# Patient Record
Sex: Female | Born: 1960 | Race: White | Hispanic: No | Marital: Married | Smoking: Current every day smoker
Health system: Southern US, Community
[De-identification: ages and names within clinical notes are randomized; demographics above are authoritative.]

## PROBLEM LIST (undated history)

## (undated) DIAGNOSIS — I10 Essential (primary) hypertension: Secondary | ICD-10-CM

---

## 2009-12-24 ENCOUNTER — Emergency Department: Payer: Self-pay | Admitting: Emergency Medicine

## 2010-02-02 ENCOUNTER — Inpatient Hospital Stay: Payer: Self-pay | Admitting: Unknown Physician Specialty

## 2011-01-17 ENCOUNTER — Emergency Department: Payer: Self-pay | Admitting: Emergency Medicine

## 2011-01-27 ENCOUNTER — Emergency Department: Payer: Self-pay | Admitting: Emergency Medicine

## 2011-01-28 ENCOUNTER — Emergency Department: Payer: Self-pay | Admitting: Unknown Physician Specialty

## 2013-12-12 ENCOUNTER — Emergency Department: Payer: Self-pay | Admitting: Emergency Medicine

## 2014-02-28 ENCOUNTER — Emergency Department: Payer: Self-pay | Admitting: Emergency Medicine

## 2014-07-26 ENCOUNTER — Emergency Department: Payer: Self-pay | Admitting: Emergency Medicine

## 2014-07-26 LAB — URINALYSIS, COMPLETE
BACTERIA: NONE SEEN
BILIRUBIN, UR: NEGATIVE
Glucose,UR: NEGATIVE mg/dL (ref 0–75)
Leukocyte Esterase: NEGATIVE
Nitrite: NEGATIVE
PH: 6 (ref 4.5–8.0)
Protein: NEGATIVE
RBC,UR: 1 /HPF (ref 0–5)
SPECIFIC GRAVITY: 1.017 (ref 1.003–1.030)
Squamous Epithelial: 3

## 2014-07-26 LAB — COMPREHENSIVE METABOLIC PANEL
ALT: 38 U/L
Albumin: 3.4 g/dL (ref 3.4–5.0)
Alkaline Phosphatase: 81 U/L
Anion Gap: 10 (ref 7–16)
BUN: 12 mg/dL (ref 7–18)
Bilirubin,Total: 0.4 mg/dL (ref 0.2–1.0)
CO2: 23 mmol/L (ref 21–32)
Calcium, Total: 8.7 mg/dL (ref 8.5–10.1)
Chloride: 107 mmol/L (ref 98–107)
Creatinine: 0.74 mg/dL (ref 0.60–1.30)
EGFR (African American): >60
EGFR (Non-African Amer.): >60
GLUCOSE: 94 mg/dL (ref 65–99)
Osmolality: 279 (ref 275–301)
Potassium: 3.9 mmol/L (ref 3.5–5.1)
SGOT(AST): 44 U/L — ABNORMAL HIGH (ref 15–37)
Sodium: 140 mmol/L (ref 136–145)
Total Protein: 7.9 g/dL (ref 6.4–8.2)

## 2014-07-26 LAB — CBC WITH DIFFERENTIAL/PLATELET
BASOS ABS: 0.2 10*3/uL — AB (ref 0.0–0.1)
Basophil %: 1.2 %
EOS ABS: 0.3 10*3/uL (ref 0.0–0.7)
Eosinophil %: 1.8 %
HCT: 43.1 % (ref 35.0–47.0)
HGB: 14.2 g/dL (ref 12.0–16.0)
LYMPHS ABS: 2.6 10*3/uL (ref 1.0–3.6)
LYMPHS PCT: 18.5 %
MCH: 31.5 pg (ref 26.0–34.0)
MCHC: 32.9 g/dL (ref 32.0–36.0)
MCV: 96 fL (ref 80–100)
MONOS PCT: 5.1 %
Monocyte #: 0.7 x10 3/mm (ref 0.2–0.9)
Neutrophil #: 10.2 10*3/uL — ABNORMAL HIGH (ref 1.4–6.5)
Neutrophil %: 73.4 %
Platelet: 349 10*3/uL (ref 150–440)
RBC: 4.51 10*6/uL (ref 3.80–5.20)
RDW: 13.5 % (ref 11.5–14.5)
WBC: 13.9 10*3/uL — ABNORMAL HIGH (ref 3.6–11.0)

## 2014-07-28 LAB — URINE CULTURE

## 2014-11-11 ENCOUNTER — Emergency Department: Payer: Self-pay | Admitting: Emergency Medicine

## 2016-01-01 ENCOUNTER — Emergency Department
Admission: EM | Admit: 2016-01-01 | Discharge: 2016-01-01 | Disposition: A | Discharge status: Home / Self Care | Payer: Self-pay | Attending: Emergency Medicine | Admitting: Emergency Medicine

## 2016-01-01 ENCOUNTER — Encounter: Payer: Self-pay | Admitting: Emergency Medicine

## 2016-01-01 DIAGNOSIS — Z5320 Procedure and treatment not carried out because of patient's decision for unspecified reasons: Secondary | ICD-10-CM

## 2016-01-01 DIAGNOSIS — F17200 Nicotine dependence, unspecified, uncomplicated: Secondary | ICD-10-CM | POA: Insufficient documentation

## 2016-01-01 DIAGNOSIS — K219 Gastro-esophageal reflux disease without esophagitis: Secondary | ICD-10-CM | POA: Insufficient documentation

## 2016-01-01 DIAGNOSIS — R1013 Epigastric pain: Secondary | ICD-10-CM

## 2016-01-01 MED ORDER — PROMETHAZINE HCL 25 MG PO TABS: 25.0000 mg | ORAL_TABLET | Freq: Four times a day (QID) | ORAL | Status: DC | PRN

## 2016-01-01 MED ORDER — OMEPRAZOLE 20 MG PO CPDR: 20.0000 mg | DELAYED_RELEASE_CAPSULE | Freq: Two times a day (BID) | ORAL | Status: DC

## 2016-01-01 NOTE — ED Notes (Signed)
Pt presents with epigastric pain for five days. Has tried otc med with no relief. Pt is sch to see a specialist soon. Pt states she just wants something to help with nausea and does not want any testing done today. Pt states she does not have insurance.

## 2016-01-01 NOTE — ED Provider Notes (Signed)
CSN: [id number]     Arrival date & time 01/01/16  1035 History   First MD Initiated Contact with Patient 01/01/16 1147     Chief Complaint  Patient presents with  . Abdominal Pain      HPI Comments: 55 year old female presents today complaining of reflux symptoms and epigastric abdominal pain for the past year. Pt reports over the past 5 days symptoms have gotten worse. Continues to smoke 1/4 ppd of cigarettes. She has taken zantac, TUMS and nexium over the counter without relief. She does not complain of chest pain, dyspnea or sweats. She does have nausea with occasional vomiting. Is set up to see a GI specialist at [facility] on sliding scale since she is uninsured. She is refusing to have any testing performed, only wants medications for her symptoms.    History reviewed. No pertinent past medical history. History reviewed. No pertinent past surgical history. No family history on file. Social History  Substance Use Topics  . Smoking status: Current Some Day Smoker  . Smokeless tobacco: None  . Alcohol Use: No   OB History    No data available     Review of Systems  Respiratory: Negative for shortness of breath.   Cardiovascular: Negative for chest pain.  Gastrointestinal: Positive for nausea and abdominal pain. Negative for vomiting and diarrhea.  All other systems reviewed and are negative.     Allergies  Codeine  Home Medications   Prior to Admission medications   Medication Sig Start Date End Date Taking? Authorizing Provider  omeprazole (PRILOSEC) 20 MG capsule Take 1 capsule (20 mg total) by mouth 2 (two) times daily. 01/01/16 12/31/16  Christella Scheuermann, PA-C  promethazine (PHENERGAN) 25 MG tablet Take 1 tablet (25 mg total) by mouth every 6 (six) hours as needed for nausea or vomiting. 01/01/16   Suan Halter V, PA-C   BP 150/81 mmHg  Pulse 94  Temp(Src) 97.9 F (36.6 C) (Oral)  Resp 20  Ht  (1.575 m)  Wt 74.844 kg  BMI 30.17 kg/m2  SpO2 98% Physical Exam   Constitutional: She is oriented to person, place, and time. Vital signs are normal. She appears well-developed and well-nourished. She is active.  Non-toxic appearance. She does not have a sickly appearance. She does not appear ill.  HENT:  Head: Normocephalic and atraumatic.  Cardiovascular: Normal rate, regular rhythm, normal heart sounds and intact distal pulses.  Exam reveals no gallop and no friction rub.   No murmur heard. Pulmonary/Chest: Effort normal and breath sounds normal. No respiratory distress. She has no wheezes. She has no rales.  Abdominal: Soft. Bowel sounds are normal. She exhibits no distension. There is tenderness. There is no rebound and no guarding.  Musculoskeletal: Normal range of motion.  Neurological: She is alert and oriented to person, place, and time.  Skin: Skin is warm and dry.  Psychiatric: She has a normal mood and affect. Her behavior is normal. Judgment and thought content normal.  Nursing note and vitals reviewed.   ED Course  Procedures (including critical care time) Labs Review Labs Reviewed - No data to display  Imaging Review No results found. I have personally reviewed and evaluated these images and lab results as part of my medical decision-making.   EKG Interpretation None      MDM  Long discussion with patient regarding my recommended treatment plan. I recommended we check a full set of labs including cardiac enzymes given location of pain. Also recommended  plain films of chest, EKG and possible abdominal imaging pending results. Pt refuses all treatments citing she does not want to pay for these services. She does not have a PCP, will be seen by [facility]. She understands she could have heart disease, perforated ulcer, cholecystitis, internal bleeding or a variety of other conditions that could result in her death. I am unable to evaluate if any of these are present because of her refusal, and she acknowledges we are not liable given she  refused tese evaluations  Encouraged her to quit smoking and establish PCP Prilosec  BID and phenergan as needed for nausea   Final diagnoses:  Gastroesophageal reflux disease, esophagitis presence not specified  Epigastric pain  Refusal of treatment        Christella Scheuermann, PA-C 01/01/16 1231  Myrna Blazer, MD 01/01/16 [phone]

## 2016-01-01 NOTE — ED Notes (Signed)
Pt to ed with c/o upper abd pain, states she is currently under treatment for reflux and has appt, with specialist soon.  Pt does not want any testing done today.  Pt states any food makes pain worse.  Tried otcs without extended relief.

## 2019-12-27 ENCOUNTER — Ambulatory Visit: Payer: Self-pay | Attending: Internal Medicine

## 2019-12-27 DIAGNOSIS — Z20822 Contact with and (suspected) exposure to covid-19: Secondary | ICD-10-CM | POA: Insufficient documentation

## 2019-12-29 ENCOUNTER — Telehealth: Payer: Self-pay

## 2019-12-29 LAB — NOVEL CORONAVIRUS, NAA: SARS-CoV-2, NAA: NOT DETECTED

## 2019-12-29 NOTE — Telephone Encounter (Signed)
Pt notified of negative COVID-19 results. Understanding verbalized.  Karen Dalton   

## 2019-12-29 NOTE — Telephone Encounter (Signed)
Caller advise result not back yet 

## 2020-01-20 ENCOUNTER — Other Ambulatory Visit: Payer: Self-pay

## 2020-01-23 ENCOUNTER — Other Ambulatory Visit: Payer: Self-pay

## 2021-06-19 ENCOUNTER — Encounter: Payer: Self-pay | Admitting: Emergency Medicine

## 2021-06-19 ENCOUNTER — Emergency Department: Payer: No Typology Code available for payment source

## 2021-06-19 ENCOUNTER — Other Ambulatory Visit: Payer: Self-pay

## 2021-06-19 ENCOUNTER — Emergency Department
Admission: EM | Admit: 2021-06-19 | Discharge: 2021-06-19 | Disposition: A | Discharge status: Home / Self Care | Payer: No Typology Code available for payment source | Attending: Emergency Medicine | Admitting: Emergency Medicine

## 2021-06-19 DIAGNOSIS — F17200 Nicotine dependence, unspecified, uncomplicated: Secondary | ICD-10-CM | POA: Diagnosis not present

## 2021-06-19 DIAGNOSIS — S300XXA Contusion of lower back and pelvis, initial encounter: Secondary | ICD-10-CM | POA: Diagnosis not present

## 2021-06-19 DIAGNOSIS — S4991XA Unspecified injury of right shoulder and upper arm, initial encounter: Secondary | ICD-10-CM | POA: Diagnosis present

## 2021-06-19 DIAGNOSIS — M542 Cervicalgia: Secondary | ICD-10-CM | POA: Diagnosis not present

## 2021-06-19 DIAGNOSIS — S20229A Contusion of unspecified back wall of thorax, initial encounter: Secondary | ICD-10-CM

## 2021-06-19 DIAGNOSIS — S7002XA Contusion of left hip, initial encounter: Secondary | ICD-10-CM | POA: Diagnosis not present

## 2021-06-19 DIAGNOSIS — Y990 Civilian activity done for income or pay: Secondary | ICD-10-CM | POA: Diagnosis not present

## 2021-06-19 DIAGNOSIS — R110 Nausea: Secondary | ICD-10-CM | POA: Diagnosis not present

## 2021-06-19 DIAGNOSIS — S7000XA Contusion of unspecified hip, initial encounter: Secondary | ICD-10-CM

## 2021-06-19 DIAGNOSIS — W19XXXA Unspecified fall, initial encounter: Secondary | ICD-10-CM

## 2021-06-19 DIAGNOSIS — W010XXA Fall on same level from slipping, tripping and stumbling without subsequent striking against object, initial encounter: Secondary | ICD-10-CM | POA: Diagnosis not present

## 2021-06-19 DIAGNOSIS — S40011A Contusion of right shoulder, initial encounter: Secondary | ICD-10-CM | POA: Insufficient documentation

## 2021-06-19 MED ORDER — METHOCARBAMOL 500 MG PO TABS: 500.0000 mg | ORAL_TABLET | Freq: Four times a day (QID) | ORAL | 0 refills | Status: DC | PRN

## 2021-06-19 MED ORDER — ETODOLAC 400 MG PO TABS: 400.0000 mg | ORAL_TABLET | Freq: Two times a day (BID) | ORAL | 0 refills | Status: AC

## 2021-06-19 MED ORDER — ONDANSETRON 4 MG PO TBDP
4.0000 mg | ORAL_TABLET | Freq: Once | ORAL | Status: AC
  Administered 2021-06-19: 4 mg via ORAL
  Filled 2021-06-19: qty 1

## 2021-06-19 MED ORDER — ONDANSETRON 4 MG PO TBDP: 4.0000 mg | ORAL_TABLET | Freq: Three times a day (TID) | ORAL | 0 refills | Status: DC | PRN

## 2021-06-19 NOTE — ED Provider Notes (Signed)
[facility] Emergency Department Provider Note   ____________________________________________   Event Date/Time   First MD Initiated Contact with Patient 06/19/21 1207     (approximate)  I have reviewed the triage vital signs and the nursing notes.   HISTORY  Chief Complaint Back Pain, Neck Pain, and Spasms    HPI Karen Dalton is a 60 y.o. female presents to the ED with complaint of low back, left hip and cervical pain.  Patient states that she fell while at work slipping on a lemon wedge on the floor.  This occurred 6 days ago and initially she was seen at an urgent care where she was given muscle relaxants and tramadol.  No x-rays were taken at that time.  Patient reports that the tramadol made her nauseous and she has been vomiting.  She has gotten minimal relief from these medications.  She rates her pain as a 9 out of 10.         History reviewed. No pertinent past medical history.  There are no problems to display for this patient.   History reviewed. No pertinent surgical history.  Prior to Admission medications   Medication Sig Start Date End Date Taking? Authorizing Provider  DULoxetine (CYMBALTA) 30 MG capsule Take 30 mg by mouth daily.   Yes [provider]  etodolac (LODINE) 400 MG tablet Take 1 tablet (400 mg total) by mouth 2 (two) times daily. 06/19/21 06/19/22 Yes Elvin Mccartin L, PA-C  methocarbamol (ROBAXIN) 500 MG tablet Take 1 tablet (500 mg total) by mouth every 6 (six) hours as needed for muscle spasms. 06/19/21  Yes Bridget Hartshorn L, PA-C  ondansetron (ZOFRAN ODT) 4 MG disintegrating tablet Take 1 tablet (4 mg total) by mouth every 8 (eight) hours as needed for nausea or vomiting. 06/19/21  Yes Bridget Hartshorn L, PA-C    Allergies Codeine and Tramadol  History reviewed. No pertinent family history.  Social History Social History   Tobacco Use   Smoking status: Some Days    Pack years: 0.00  Substance Use Topics    Alcohol use: No    Review of Systems Constitutional: No fever/chills Eyes: No visual changes. ENT: No trauma. Cardiovascular: Denies chest pain. Respiratory: Denies shortness of breath. Gastrointestinal: No abdominal pain.  No nausea, positive vomiting.  Genitourinary: Negative for dysuria. Musculoskeletal: Left hip, cervical spine, right shoulder and lower back pain. Skin: Negative for rash. Neurological: Negative for headaches, focal weakness or numbness. ____________________________________________   PHYSICAL EXAM:  VITAL SIGNS: ED Triage Vitals  Enc Vitals Group     BP 06/19/21 1114 (!) 158/108     Pulse Rate 06/19/21 1114 70     Resp 06/19/21 1114 20     Temp 06/19/21 1114 98 F (36.7 C)     Temp Source 06/19/21 1114 Oral     SpO2 06/19/21 1114 97 %     Weight 06/19/21 1115 164 lb 14.5 oz (74.8 kg)     Height 06/19/21 1115 5\' 2"  (1.575 m)     Head Circumference --      Peak Flow --      Pain Score 06/19/21 1114 9     Pain Loc --      Pain Edu? --      Excl. in GC? --     Constitutional: Alert and oriented. Well appearing and in no acute distress. Eyes: Conjunctivae are normal. PERRL. EOMI. Head: Atraumatic. Nose: No trauma. Neck: No stridor.  No tenderness on  palpation of cervical spine posteriorly. Cardiovascular: Normal rate, regular rhythm. Grossly normal heart sounds.  Good peripheral circulation. Respiratory: Normal respiratory effort.  No retractions. Lungs CTAB. Gastrointestinal: Soft and nontender. No distention.  Musculoskeletal: Moderate generalized tenderness on palpation of the right shoulder posteriorly.  Range of motion is slow and guarded secondary to discomfort.  Patient is also tender on palpation of thoracic and lumbar spine.  No obvious deformity is noted no step-offs are appreciated.  Skin is without discoloration.  Left hip is tender to palpation generalized posteriorly and lateral area.  No gross deformity and no rotation or shortening of  the leg is appreciated. Neurologic:  Normal speech and language. No gross focal neurologic deficits are appreciated.  Skin:  Skin is warm, dry and intact. No rash noted. Psychiatric: Mood and affect are normal. Speech and behavior are normal.  ____________________________________________   LABS (all labs ordered are listed, but only abnormal results are displayed)  Labs Reviewed - No data to display ____________________________________________  EKG  ____________________________________________  RADIOLOGY Beaulah Corin, personally viewed and evaluated these images (plain radiographs) as part of my medical decision making, as well as reviewing the written report by the radiologist.   Official radiology report(s): DG Thoracic Spine 2 View  Result Date: 06/19/2021 CLINICAL DATA:  pain/ fall injury EXAM: THORACIC SPINE 2 VIEWS COMPARISON:  None. FINDINGS: There is no evidence of thoracic spine fracture. There are multilevel degenerative changes. IMPRESSION: No evidence of thoracic spine fracture. Mild multilevel degenerative changes. Electronically Signed   By: Caprice Renshaw   On: 06/19/2021 14:10   DG Lumbar Spine 2-3 Views  Result Date: 06/19/2021 CLINICAL DATA:  pain/ fall injury EXAM: LUMBAR SPINE - 2-3 VIEW COMPARISON:  None. FINDINGS: There are 5 non-rib-bearing lumbar vertebrae. There is no evidence of lumbar spine fracture. There is moderate disc height loss at L2-L3 and L5-S1. There is mild disc height loss at L3-L4. There is mild-moderate lower lumbar predominant facet arthritis. Calcified mass overlying the pelvis, likely calcified uterine fibroid. IMPRESSION: No evidence of lumbar spine fracture. Multilevel degenerative disc disease, worst at L2-L3 and L5-S1 with moderate disc height loss. Mild to moderate lower lumbar predominant facet arthritis. Electronically Signed   By: Caprice Renshaw   On: 06/19/2021 14:12   DG Shoulder Right  Result Date: 06/19/2021 CLINICAL DATA:  pain/  fall injury EXAM: RIGHT SHOULDER - 2+ VIEW COMPARISON:  None. FINDINGS: There is no evidence of acute fracture or dislocation. There is mild glenohumeral and AC joint degenerative change. IMPRESSION: No acute fracture or dislocation. Electronically Signed   By: Caprice Renshaw   On: 06/19/2021 14:13   DG HIP UNILAT WITH PELVIS 2-3 VIEWS LEFT  Result Date: 06/19/2021 CLINICAL DATA:  Left hip pain EXAM: DG HIP (WITH OR WITHOUT PELVIS) 2-3V LEFT COMPARISON:  None. FINDINGS: There is no evidence of hip fracture or dislocation. There is no evidence of arthropathy or other focal bone abnormality. There is a calcified mass overlying the pelvis, likely calcified uterine fibroid. IMPRESSION: Negative left hip radiographs. Electronically Signed   By: Caprice Renshaw   On: 06/19/2021 14:08    ____________________________________________   PROCEDURES  Procedure(s) performed (including Critical Care):  Procedures   ____________________________________________   INITIAL IMPRESSION / ASSESSMENT AND PLAN / ED COURSE  As part of my medical decision making, I reviewed the following data within the electronic MEDICAL RECORD NUMBER Notes from prior ED visits and [place] Controlled Substance Database  60 year old female presents to the  ED with multiple muscle skeletal pain after she slipped on a lemon wedge 6 days ago.  Initially she was seen at urgent care and started on medication.  She states that the tramadol caused nausea and vomiting and that the Flexeril and Voltaren have not helped with her pain.  Patient was given Zofran while in the ED.  X-rays were negative for any acute bony injury however she did have some degenerative changes noted in her lumbar and thoracic spine.  This was discussed with patient.  She is to discontinue taking the tramadol.  A prescription for Zofran was sent to the pharmacy along with etodolac 400 mg twice daily, methocarbamol 500 mg every 6 hours and Zofran if needed for nausea.  Patient is to  follow-up with her companies [employer]. to see if they have a specific doctor that they wish for her to see.  She also may follow-up with urgent care if any continued problems.  She is encouraged to use ice or heat to her muscles as needed for discomfort.  She is return to the emergency department if any severe worsening of her symptoms.   ____________________________________________   FINAL CLINICAL IMPRESSION(S) / ED DIAGNOSES  Final diagnoses:  Fall  Contusion of back, unspecified laterality, initial encounter  Contusion of right shoulder, initial encounter  Contusion of hip, unspecified laterality, initial encounter     ED Discharge Orders          Ordered    ondansetron (ZOFRAN ODT) 4 MG disintegrating tablet  Every 8 hours PRN        06/19/21 1439    etodolac (LODINE) 400 MG tablet  2 times daily        06/19/21 1439    methocarbamol (ROBAXIN) 500 MG tablet  Every 6 hours PRN        06/19/21 1439             Note:  This document was prepared using Dragon voice recognition software and may include unintentional dictation errors.    Tommi Rumps, PA-C 06/19/21 1538    Arnaldo Natal, MD 06/19/21 [phone]

## 2021-06-19 NOTE — ED Notes (Signed)
Patient transported to X-ray 

## 2021-06-19 NOTE — ED Triage Notes (Signed)
Pt comes into the ED via POV c/o low back over the left hip and neck pain.  Pt also states she has muscles spasms in her back and down through her leg.  All this started after slipping on a lemon wedge at work last Thursday.  Pt has been taking muscle relaxers and tramadol, but the tramadol made her nauseas.

## 2021-06-19 NOTE — ED Notes (Signed)
Pt calm , collective, ambulatory upon discharge 

## 2021-06-19 NOTE — Discharge Instructions (Addendum)
Continue taking medication that you received from [facility] to prevent any further confusion.  Prescription were printed so that if you find out that your company prefers to to get your prescriptions filled at a particular pharmacy you will have your prescriptions in your hand.  Medications only as directed.  The etodolac is 400 mg twice daily with food for inflammation and pain, methocarbamol is 1 every 6 hours as needed for muscle spasms and Zofran if needed for nausea or vomiting.  You may use ice or heat to your back, hip, and shoulder as needed for discomfort.  If any further evaluation or treatment is needed you should follow-up with the doctor that your company uses as Workmen's Comp.

## 2021-09-29 ENCOUNTER — Other Ambulatory Visit: Payer: Self-pay

## 2021-09-29 ENCOUNTER — Emergency Department: Payer: Self-pay

## 2021-09-29 ENCOUNTER — Emergency Department
Admission: EM | Admit: 2021-09-29 | Discharge: 2021-09-29 | Disposition: A | Discharge status: Home / Self Care | Payer: Self-pay | Attending: Emergency Medicine | Admitting: Emergency Medicine

## 2021-09-29 DIAGNOSIS — R0781 Pleurodynia: Secondary | ICD-10-CM

## 2021-09-29 DIAGNOSIS — R051 Acute cough: Secondary | ICD-10-CM

## 2021-09-29 DIAGNOSIS — Z8616 Personal history of COVID-19: Secondary | ICD-10-CM | POA: Insufficient documentation

## 2021-09-29 DIAGNOSIS — S2232XA Fracture of one rib, left side, initial encounter for closed fracture: Secondary | ICD-10-CM | POA: Insufficient documentation

## 2021-09-29 DIAGNOSIS — R059 Cough, unspecified: Secondary | ICD-10-CM | POA: Insufficient documentation

## 2021-09-29 DIAGNOSIS — F17200 Nicotine dependence, unspecified, uncomplicated: Secondary | ICD-10-CM | POA: Insufficient documentation

## 2021-09-29 DIAGNOSIS — W19XXXA Unspecified fall, initial encounter: Secondary | ICD-10-CM | POA: Insufficient documentation

## 2021-09-29 MED ORDER — HYDROCODONE-ACETAMINOPHEN 5-325 MG PO TABS: 1.0000 | ORAL_TABLET | Freq: Four times a day (QID) | ORAL | 0 refills | Status: DC | PRN

## 2021-09-29 MED ORDER — HYDROCODONE-ACETAMINOPHEN 5-325 MG PO TABS
2.0000 | ORAL_TABLET | Freq: Once | ORAL | Status: AC
  Administered 2021-09-29: 2 via ORAL
  Filled 2021-09-29: qty 2

## 2021-09-29 NOTE — ED Provider Notes (Addendum)
[facility] Emergency Department Provider Note  ____________________________________________   Event Date/Time   First MD Initiated Contact with Patient 09/29/21 0335     (approximate)  I have reviewed the triage vital signs and the nursing notes.   HISTORY  Chief Complaint Chest Pain (Home viam ems rib pain 2 days)    HPI Karen Dalton is a 60 y.o. female who presents for evaluation of left lateral lower sided rib pain.  She says she had a mechanical fall couple of nights ago but the pain is steadily gotten worse.  She now feels like there is a sharp stabbing pain that she is afraid is a piece of bone going into her lungs.  It is worse with deep breaths and worse with cough.  She has had a cough for a while now because she was diagnosed with COVID-19 2 to 3 weeks ago.  She said that the cough comes and goes but has been worse over the last 24 hours.  The pain is sharp and stabbing.  Nothing particular makes it better.  She denies fever, any other type of chest pain, nausea, vomiting, and abdominal pain.     No past medical history on file.  There are no problems to display for this patient.   No past surgical history on file.  Prior to Admission medications   Medication Sig Start Date End Date Taking? Authorizing Provider  HYDROcodone-acetaminophen (NORCO/VICODIN) 5-325 MG tablet Take 1-2 tablets by mouth every 6 (six) hours as needed for moderate pain or severe pain. 09/29/21  Yes Loleta Rose, MD  DULoxetine (CYMBALTA) 30 MG capsule Take 30 mg by mouth daily.    [provider]  etodolac (LODINE) 400 MG tablet Take 1 tablet (400 mg total) by mouth 2 (two) times daily. 06/19/21 06/19/22  Tommi Rumps, PA-C  methocarbamol (ROBAXIN) 500 MG tablet Take 1 tablet (500 mg total) by mouth every 6 (six) hours as needed for muscle spasms. 06/19/21   Tommi Rumps, PA-C  ondansetron (ZOFRAN ODT) 4 MG disintegrating tablet Take 1 tablet (4 mg total)  by mouth every 8 (eight) hours as needed for nausea or vomiting. 06/19/21   Tommi Rumps, PA-C    Allergies Codeine and Tramadol  No family history on file.  Social History Social History   Tobacco Use   Smoking status: Some Days  Substance Use Topics   Alcohol use: No    Review of Systems Constitutional: No fever/chills Eyes: No visual changes. ENT: No sore throat. Cardiovascular: Left lateral lower rib/chest pain. Respiratory: Positive for cough and some shortness of breath associated with the chest pain. Gastrointestinal: No abdominal pain.  No nausea, no vomiting.  No diarrhea.  No constipation. Genitourinary: Negative for dysuria. Musculoskeletal: Negative for neck pain.  Negative for back pain. Integumentary: Negative for rash. Neurological: Negative for headaches, focal weakness or numbness.   ____________________________________________   PHYSICAL EXAM:  VITAL SIGNS: ED Triage Vitals  Enc Vitals Group     BP 09/29/21 0338 (!) 169/72     Pulse Rate 09/29/21 0338 65     Resp 09/29/21 0338 11     Temp 09/29/21 0338 97.6 F (36.4 C)     Temp Source 09/29/21 0338 Oral     SpO2 09/29/21 0331 90 %     Weight --      Height --      Head Circumference --      Peak Flow --  Pain Score 09/29/21 0341 10     Pain Loc --      Pain Edu? --      Excl. in GC? --     Constitutional: Alert and oriented.  Appears uncomfortable. Eyes: Conjunctivae are normal.  Head: Atraumatic. Nose: No congestion/rhinnorhea. Mouth/Throat: Patient is wearing a mask. Neck: No stridor.  No meningeal signs.   Cardiovascular: Normal rate, regular rhythm. Good peripheral circulation. Respiratory: Increased respiratory effort but it is because the patient is splinting due to pain.  Lungs are clear to auscultation.  Occasional mild dry cough. Gastrointestinal: Soft and nontender. No distention.  Musculoskeletal: Severe tenderness to palpation of the left lateral lower and posterior  ribs.  There is a an area of bruising right at the bottom of her ribs on the lateral aspects and slightly posterior. Neurologic:  Normal speech and language. No gross focal neurologic deficits are appreciated.  Skin:  Skin is warm, dry and intact.  Mild bruising as described above.   ____________________________________________    RADIOLOGY I, Loleta Rose, personally viewed and evaluated these images (plain radiographs) as part of my medical decision making, as well as reviewing the written report by the radiologist.  ED MD interpretation: Possible multiple fractures on chest x-ray, but verified on CT scan of the chest that there is a left eighth rib fracture with a small adjacent subpleural hemorrhage.  Official radiology report(s): DG Ribs Unilateral W/Chest Left  Result Date: 09/29/2021 CLINICAL DATA:  Fall, COVID positive EXAM: LEFT RIBS AND CHEST - 3+ VIEW COMPARISON:  07/26/2014 FINDINGS: Lungs are essentially clear.  No pleural effusion or pneumothorax. The heart is normal in size. Mildly displaced left posterolateral 8th rib fracture. Nondisplaced left posterolateral 6th rib fracture. IMPRESSION: Left posterolateral 6th and 8th rib fractures, as above. No pneumothorax. Electronically Signed   By: Charline Bills M.D.   On: 09/29/2021 04:01   CT Chest Wo Contrast  Result Date: 09/29/2021 CLINICAL DATA:  Rib fracture suspected EXAM: CT CHEST WITHOUT CONTRAST TECHNIQUE: Multidetector CT imaging of the chest was performed following the standard protocol without IV contrast. COMPARISON:  Radiography from earlier today FINDINGS: Cardiovascular: No significant vascular findings. Normal heart size. No pericardial effusion. Atheromatous calcification of the right coronary artery Mediastinum/Nodes: No hematoma or pneumomediastinum Lungs/Pleura: There is no edema, consolidation, effusion, or pneumothorax. Mild atelectasis. Upper Abdomen: Partially covered low-density in the inferior right  liver, stable from 2020 CT with there was cystic density. Musculoskeletal: Mildly displaced left posterior eighth rib fracture with adjacent subpleural hemorrhage. Midthoracic disc degeneration. IMPRESSION: 1. Left eighth rib fracture.  No hemothorax or pneumothorax. 2. Coronary calcification. Electronically Signed   By: Tiburcio Pea M.D.   On: 09/29/2021 05:00    ____________________________________________  EKG  ED ECG REPORT I, Loleta Rose, the attending physician, personally viewed and interpreted this ECG.  Date: 09/29/2021 EKG Time: 3:38 AM Rate: 64 Rhythm: normal sinus rhythm QRS Axis: normal Intervals: normal ST/T Wave abnormalities: Non-specific ST segment / T-wave changes, but no clear evidence of acute ischemia. Narrative Interpretation: no definitive evidence of acute ischemia; does not meet STEMI criteria.    ____________________________________________   INITIAL IMPRESSION / MDM / ASSESSMENT AND PLAN / ED COURSE  As part of my medical decision making, I reviewed the following data within the electronic MEDICAL RECORD NUMBER Nursing notes reviewed and incorporated, Old chart reviewed, Radiograph reviewed , Notes from prior ED visits, and [place] Controlled Substance Database   Differential diagnosis includes, but is not limited to,  rib fracture, rib contusion, pneumothorax, pleural effusion, interstitial edema, atypical infection due to COVID-19, developing pneumonia.  I strongly suspect the patient has at least a rib contusion if not a fracture based on the physical exam and the nature of her symptoms.  Lungs are clear to auscultation and equal bilaterally and I do not think she has a pneumothorax.  Given the degree of severe pain she is experiencing I will give 2 Norco which will also help as an antitussive and we will obtain left-sided rib x-rays with a chest film as well.  She may benefit from a CT scan for additional assessment of the lung parenchyma as well as of the ribs,  but I will reassess after x-rays.     Clinical Course as of 09/29/21 1610  Wynelle Link Sep 29, 2021  0404 I personally reviewed the patient's imaging and agree with the radiologist's interpretation that there are at least 2 rib fractures, but given the amount of distress and discomfort she is experiencing, I am concerned that there may be more extensive injuries.  Given the morbidity and mortality associated with multiple rib fractures, even though she is not a geriatric population, I think it is appropriate to further assess with a CT chest without contrast. [CF]  9604 The patient is now much more comfortable.  I updated her about the 1 rib fractures seen on x-ray with a little bit of bleeding surrounding it.  I stressed to her the importance of pain control and use of incentive spirometer.  She said that she understands.  I gave strict return precautions as well as instructions about how to use the incentive spirometer and she understands and agrees with the plan and is comfortable going home. [CF]    Clinical Course User Index [CF] Loleta Rose, MD     ____________________________________________  FINAL CLINICAL IMPRESSION(S) / ED DIAGNOSES  Final diagnoses:  Rib pain on left side  Acute cough  Fall, initial encounter  Closed fracture of one rib of left side, initial encounter     MEDICATIONS GIVEN DURING THIS VISIT:  Medications  HYDROcodone-acetaminophen (NORCO/VICODIN) 5-325 MG per tablet 2 tablet (2 tablets Oral Given 09/29/21 0400)     ED Discharge Orders          Ordered    HYDROcodone-acetaminophen (NORCO/VICODIN) 5-325 MG tablet  Every 6 hours PRN        09/29/21 5409             Note:  This document was prepared using Dragon voice recognition software and may include unintentional dictation errors.   Loleta Rose, MD 09/29/21 8119    Loleta Rose, MD 09/29/21 [phone]

## 2021-09-29 NOTE — ED Notes (Signed)
Provided education on incentive spirometer.

## 2021-09-29 NOTE — Discharge Instructions (Signed)
Your workup today showed that you have a fracture to one or more ribs.  Unfortunately this type of injury hurts but there is no way to fix it immediately; it must heal over time.  Be sure to take plenty of deep breaths so that you get rid of the "bad air" in your lungs.  If you are given a device called an incentive spirometer, please use it as recommended.  Unless you have been told by your doctor not to do so, we recommend you take ibuprofen 600 mg 3 times daily with meals for no more than 5 days.  You can also take Tylenol 1000 mg every 6 hours for pain. Take Norco  as prescribed for severe pain. Do not drink alcohol, drive or participate in any other potentially dangerous activities while taking this medication as it may make you sleepy. Do not take this medication with any other sedating medications, either prescription or over-the-counter. If you were prescribed Percocet or Vicodin, do not take these with acetaminophen (Tylenol) as it is already contained within these medications.   This medication is an opiate (or narcotic) pain medication and can be habit forming.  Use it as little as possible to achieve adequate pain control.  Do not use or use it with extreme caution if you have a history of opiate abuse or dependence.  If you are on a pain contract with your primary care doctor or a pain specialist, be sure to let them know you were prescribed this medication today from the [facility] Emergency Department.  This medication is intended for your use only - do not give any to anyone else and keep it in a secure place where nobody else, especially children, have access to it.  It will also cause or worsen constipation, so you may want to consider taking an over-the-counter stool softener while you are taking this medication.   Follow-up at the clinics or with the doctors described in this paperwork.  Return to the emergency department if he develop new or worsening symptoms that concern you.

## 2021-09-29 NOTE — ED Triage Notes (Signed)
Pt dx of covid approx 2-3 weeks ago. Fell 2 days ago. Visible bruising und bra in back on left side. Severe sharp pain with cough. Fall was a one time occurrence d/t furniture in room. Pt now complains of congestion, severe sharp stabbing pain with cough.  Took tessalon pearls tonight. Was scared to cough when she woke up tonight afraid she was going to puncture her lung.

## 2022-06-25 ENCOUNTER — Emergency Department: Payer: Self-pay

## 2022-06-25 ENCOUNTER — Other Ambulatory Visit: Payer: Self-pay

## 2022-06-25 ENCOUNTER — Emergency Department
Admission: EM | Admit: 2022-06-25 | Discharge: 2022-06-25 | Disposition: A | Discharge status: Home / Self Care | Payer: Self-pay | Attending: Emergency Medicine | Admitting: Emergency Medicine

## 2022-06-25 DIAGNOSIS — W01198A Fall on same level from slipping, tripping and stumbling with subsequent striking against other object, initial encounter: Secondary | ICD-10-CM | POA: Insufficient documentation

## 2022-06-25 DIAGNOSIS — Z23 Encounter for immunization: Secondary | ICD-10-CM | POA: Insufficient documentation

## 2022-06-25 DIAGNOSIS — S0990XA Unspecified injury of head, initial encounter: Secondary | ICD-10-CM | POA: Insufficient documentation

## 2022-06-25 DIAGNOSIS — W19XXXA Unspecified fall, initial encounter: Secondary | ICD-10-CM

## 2022-06-25 DIAGNOSIS — S0083XA Contusion of other part of head, initial encounter: Secondary | ICD-10-CM | POA: Insufficient documentation

## 2022-06-25 MED ORDER — HYDROCODONE-ACETAMINOPHEN 5-325 MG PO TABS: 1.0000 | ORAL_TABLET | ORAL | 0 refills | Status: AC | PRN

## 2022-06-25 MED ORDER — TETANUS-DIPHTH-ACELL PERTUSSIS 5-2.5-18.5 LF-MCG/0.5 IM SUSY
0.5000 mL | PREFILLED_SYRINGE | Freq: Once | INTRAMUSCULAR | Status: AC
  Administered 2022-06-25: 0.5 mL via INTRAMUSCULAR
  Filled 2022-06-25: qty 0.5

## 2022-06-25 NOTE — ED Provider Triage Note (Signed)
Emergency Medicine Provider Triage Evaluation Note  Karen Dalton , a 61 y.o. female  was evaluated in triage.  Pt complains of fall. Tripped on a brick and fell face first last night. Unsure if she lost consciousness. Reports that she has a headache and facial pain. No blood thinners.  Review of Systems  Positive: Headache, face pain, nausea Negative: vomiting  Physical Exam  There were no vitals taken for this visit. Gen:   Awake, no distress   Resp:  Normal effort  MSK:   Moves extremities without difficulty  Other:  Abrasion to left periorbital area  Medical Decision Making  Medically screening exam initiated at 5:04 PM.  Appropriate orders placed.  Karen Dalton was informed that the remainder of the evaluation will be completed by another provider, this initial triage assessment does not replace that evaluation, and the importance of remaining in the ED until their evaluation is complete.    Jackelyn Hoehn, PA-C 06/25/22 1709
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
## 2023-02-14 IMAGING — CR DG RIBS W/ CHEST 3+V*L*
1 series · 3 of 3 positions shown · non-contrast
Comparison: 07/26/2014

CLINICAL DATA: Fall, COVID positive

EXAM:
LEFT RIBS AND CHEST - 3+ VIEW

[Series 1: dg ribs unilateral w/chest left · 0.14mm/px · 3 of 3 slices shown]
[im 1/3]
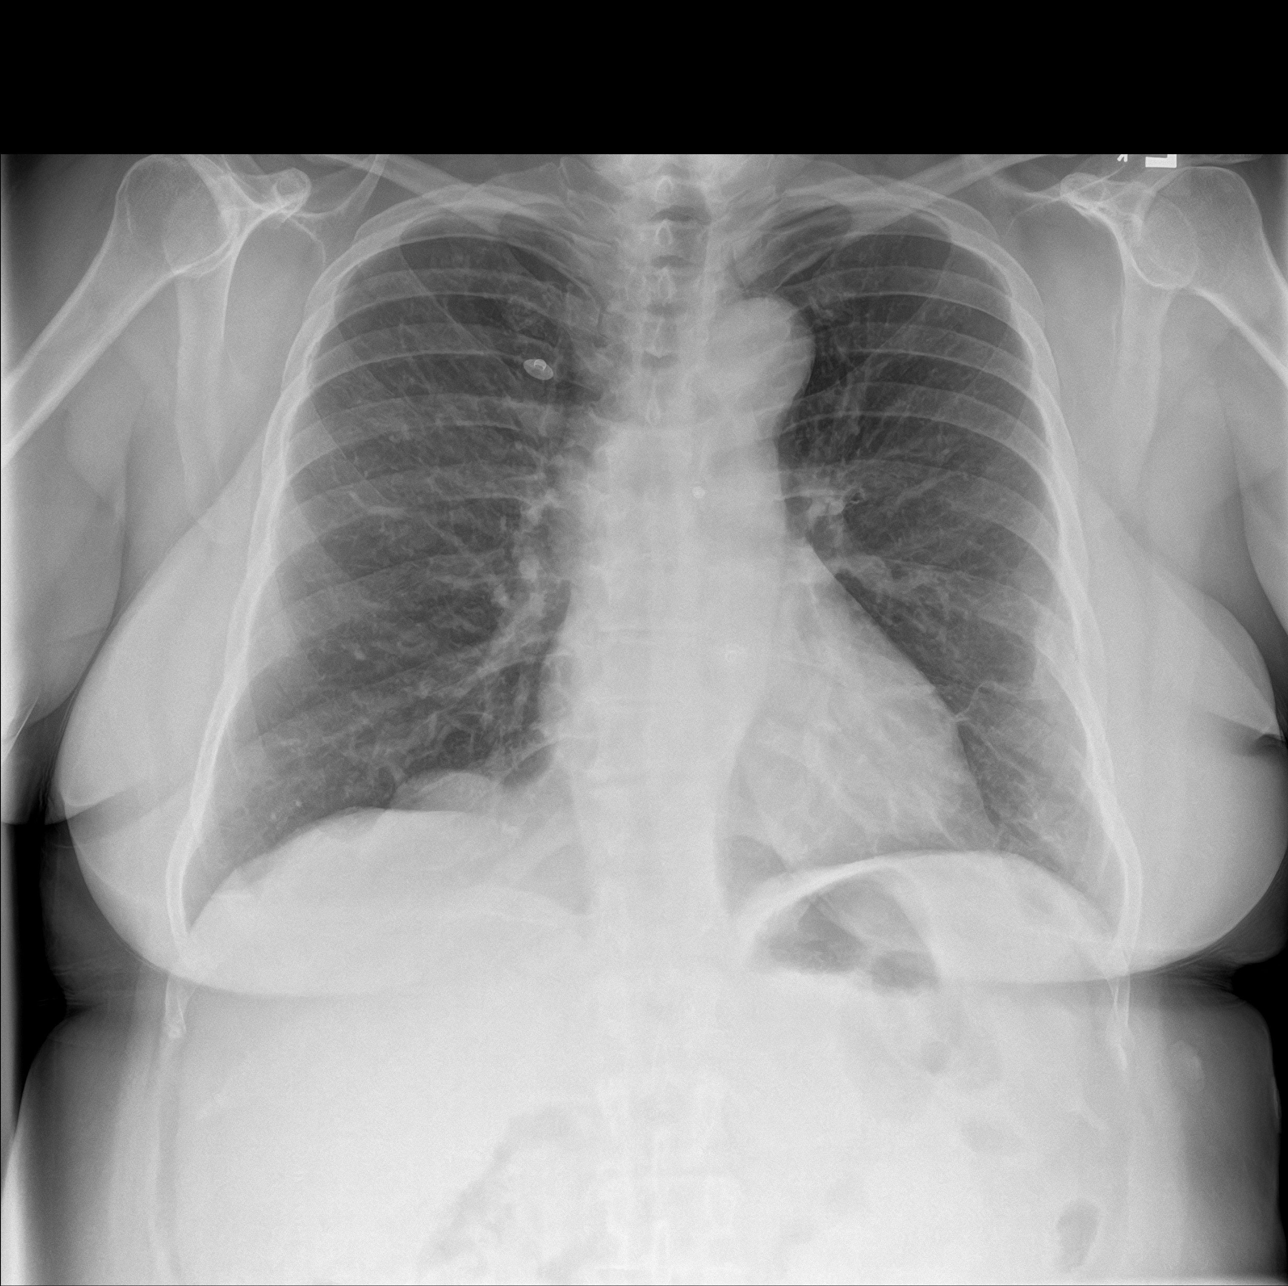
[im 2/3]
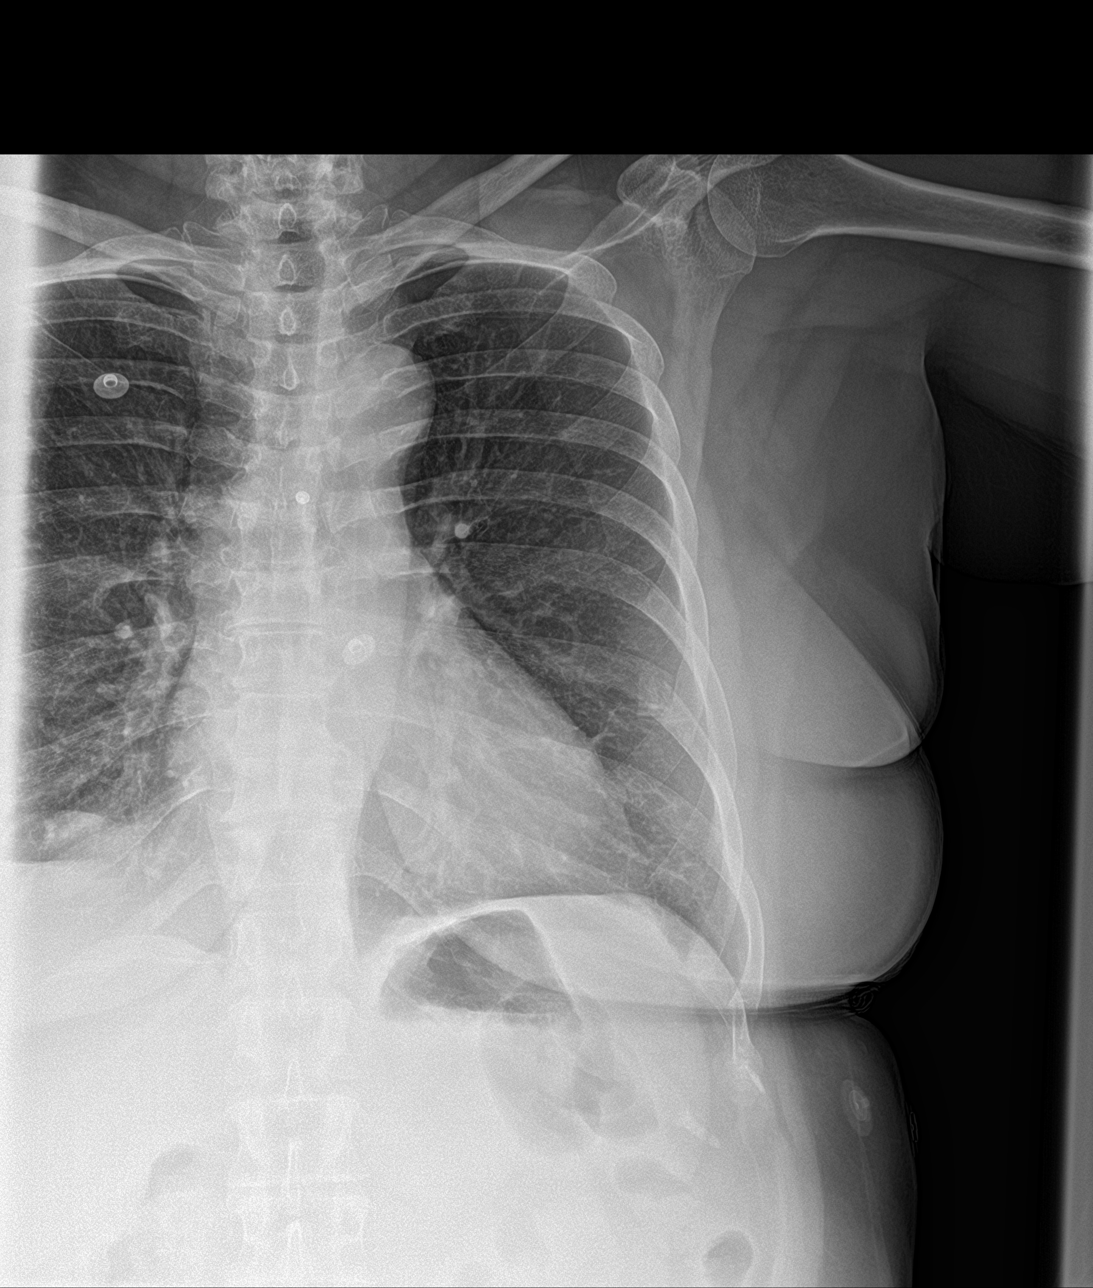
[im 3/3]
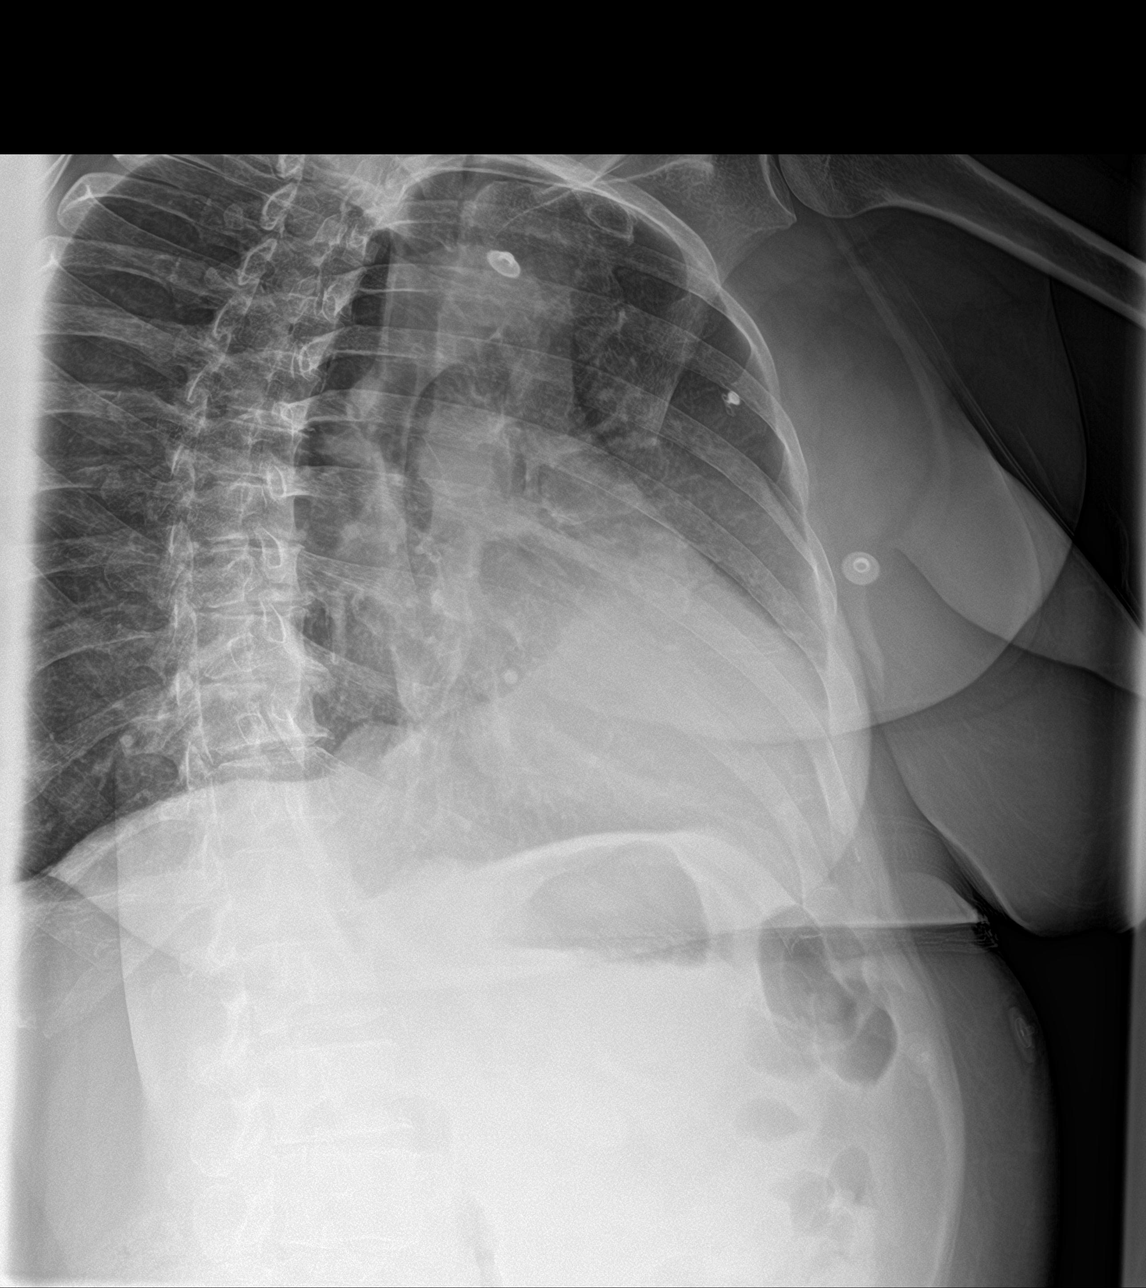

[3 of 3 positions shown; findings below may reference images not displayed]

FINDINGS: Lungs are essentially clear.  No pleural effusion or pneumothorax.

The heart is normal in size.

Mildly displaced left posterolateral 8th rib fracture. Nondisplaced
left posterolateral 6th rib fracture.
IMPRESSION: Left posterolateral 6th and 8th rib fractures, as above.

No pneumothorax.

## 2024-04-27 ENCOUNTER — Emergency Department: Payer: Self-pay

## 2024-04-27 ENCOUNTER — Other Ambulatory Visit: Payer: Self-pay

## 2024-04-27 ENCOUNTER — Observation Stay: Payer: Self-pay

## 2024-04-27 ENCOUNTER — Encounter: Payer: Self-pay | Admitting: Emergency Medicine

## 2024-04-27 ENCOUNTER — Inpatient Hospital Stay
Admission: EM | Admit: 2024-04-27 | Discharge: 2024-04-30 | DRG: 603 | Disposition: A | Discharge status: Home / Self Care | Payer: Self-pay | Attending: Family Medicine | Admitting: Family Medicine

## 2024-04-27 DIAGNOSIS — Z8249 Family history of ischemic heart disease and other diseases of the circulatory system: Secondary | ICD-10-CM

## 2024-04-27 DIAGNOSIS — W19XXXA Unspecified fall, initial encounter: Secondary | ICD-10-CM | POA: Diagnosis present

## 2024-04-27 DIAGNOSIS — S8012XA Contusion of left lower leg, initial encounter: Secondary | ICD-10-CM | POA: Diagnosis present

## 2024-04-27 DIAGNOSIS — Z720 Tobacco use: Secondary | ICD-10-CM | POA: Insufficient documentation

## 2024-04-27 DIAGNOSIS — R2242 Localized swelling, mass and lump, left lower limb: Secondary | ICD-10-CM

## 2024-04-27 DIAGNOSIS — Z6832 Body mass index (BMI) 32.0-32.9, adult: Secondary | ICD-10-CM

## 2024-04-27 DIAGNOSIS — Z79899 Other long term (current) drug therapy: Secondary | ICD-10-CM

## 2024-04-27 DIAGNOSIS — T148XXA Other injury of unspecified body region, initial encounter: Secondary | ICD-10-CM

## 2024-04-27 DIAGNOSIS — M1990 Unspecified osteoarthritis, unspecified site: Secondary | ICD-10-CM | POA: Diagnosis present

## 2024-04-27 DIAGNOSIS — Z885 Allergy status to narcotic agent status: Secondary | ICD-10-CM

## 2024-04-27 DIAGNOSIS — L03116 Cellulitis of left lower limb: Principal | ICD-10-CM | POA: Diagnosis present

## 2024-04-27 DIAGNOSIS — E669 Obesity, unspecified: Secondary | ICD-10-CM | POA: Diagnosis present

## 2024-04-27 DIAGNOSIS — Z23 Encounter for immunization: Secondary | ICD-10-CM

## 2024-04-27 DIAGNOSIS — S81802S Unspecified open wound, left lower leg, sequela: Secondary | ICD-10-CM

## 2024-04-27 DIAGNOSIS — I10 Essential (primary) hypertension: Secondary | ICD-10-CM | POA: Diagnosis present

## 2024-04-27 DIAGNOSIS — E785 Hyperlipidemia, unspecified: Secondary | ICD-10-CM | POA: Diagnosis present

## 2024-04-27 DIAGNOSIS — F17210 Nicotine dependence, cigarettes, uncomplicated: Secondary | ICD-10-CM | POA: Diagnosis present

## 2024-04-27 DIAGNOSIS — F32A Depression, unspecified: Secondary | ICD-10-CM | POA: Diagnosis present

## 2024-04-27 DIAGNOSIS — L0390 Cellulitis, unspecified: Secondary | ICD-10-CM | POA: Diagnosis present

## 2024-04-27 DIAGNOSIS — M7989 Other specified soft tissue disorders: Secondary | ICD-10-CM | POA: Insufficient documentation

## 2024-04-27 HISTORY — DX: Essential (primary) hypertension: I10

## 2024-04-27 LAB — CBC WITH DIFFERENTIAL/PLATELET
Abs Immature Granulocytes: 0.04 10*3/uL (ref 0.00–0.07)
Basophils Absolute: 0.1 10*3/uL (ref 0.0–0.1)
Basophils Relative: 1 %
Eosinophils Absolute: 0.2 10*3/uL (ref 0.0–0.5)
Eosinophils Relative: 2 %
HCT: 36 % (ref 36.0–46.0)
Hemoglobin: 11.8 g/dL — ABNORMAL LOW (ref 12.0–15.0)
Immature Granulocytes: 0 %
Lymphocytes Relative: 21 %
Lymphs Abs: 2.2 10*3/uL (ref 0.7–4.0)
MCH: 31.1 pg (ref 26.0–34.0)
MCHC: 32.8 g/dL (ref 30.0–36.0)
MCV: 94.7 fL (ref 80.0–100.0)
Monocytes Absolute: 0.7 10*3/uL (ref 0.1–1.0)
Monocytes Relative: 7 %
Neutro Abs: 7.2 10*3/uL (ref 1.7–7.7)
Neutrophils Relative %: 69 %
Platelets: 505 10*3/uL — ABNORMAL HIGH (ref 150–400)
RBC: 3.8 MIL/uL — ABNORMAL LOW (ref 3.87–5.11)
RDW: 13.7 % (ref 11.5–15.5)
WBC: 10.3 10*3/uL (ref 4.0–10.5)
nRBC: 0 % (ref 0.0–0.2)

## 2024-04-27 LAB — COMPREHENSIVE METABOLIC PANEL WITH GFR
ALT: 20 U/L (ref 0–44)
AST: 28 U/L (ref 15–41)
Albumin: 3.6 g/dL (ref 3.5–5.0)
Alkaline Phosphatase: 74 U/L (ref 38–126)
Anion gap: 9 (ref 5–15)
BUN: 14 mg/dL (ref 8–23)
CO2: 25 mmol/L (ref 22–32)
Calcium: 9.1 mg/dL (ref 8.9–10.3)
Chloride: 106 mmol/L (ref 98–111)
Creatinine, Ser: 0.67 mg/dL (ref 0.44–1.00)
GFR, Estimated: >60 mL/min (ref >60)
Glucose, Bld: 104 mg/dL — ABNORMAL HIGH (ref 70–99)
Potassium: 4.6 mmol/L (ref 3.5–5.1)
Sodium: 140 mmol/L (ref 135–145)
Total Bilirubin: 0.9 mg/dL (ref 0.0–1.2)
Total Protein: 7.3 g/dL (ref 6.5–8.1)

## 2024-04-27 LAB — LACTIC ACID, PLASMA: Lactic Acid, Venous: 1.4 mmol/L (ref 0.5–1.9)

## 2024-04-27 MED ORDER — VANCOMYCIN HCL 1500 MG/300ML IV SOLN
1500.0000 mg | INTRAVENOUS | Status: DC
  Administered 2024-04-28 – 2024-04-29 (×2): 1500 mg via INTRAVENOUS
  Filled 2024-04-27 (×3): qty 300

## 2024-04-27 MED ORDER — OXYCODONE HCL 5 MG PO TABS
5.0000 mg | ORAL_TABLET | Freq: Four times a day (QID) | ORAL | Status: DC | PRN
  Administered 2024-04-27 – 2024-04-28 (×2): 5 mg via ORAL
  Filled 2024-04-27 (×2): qty 1

## 2024-04-27 MED ORDER — HYDRALAZINE HCL 20 MG/ML IJ SOLN: 5.0000 mg | Freq: Four times a day (QID) | INTRAMUSCULAR | Status: DC | PRN

## 2024-04-27 MED ORDER — MELATONIN 5 MG PO TABS
5.0000 mg | ORAL_TABLET | Freq: Every evening | ORAL | Status: DC | PRN
  Administered 2024-04-28 – 2024-04-29 (×3): 5 mg via ORAL
  Filled 2024-04-27 (×3): qty 1

## 2024-04-27 MED ORDER — ACETAMINOPHEN 650 MG RE SUPP: 650.0000 mg | Freq: Four times a day (QID) | RECTAL | Status: DC | PRN

## 2024-04-27 MED ORDER — ONDANSETRON HCL 4 MG PO TABS
4.0000 mg | ORAL_TABLET | Freq: Four times a day (QID) | ORAL | Status: DC | PRN
  Administered 2024-04-29 – 2024-04-30 (×2): 4 mg via ORAL
  Filled 2024-04-27 (×2): qty 1

## 2024-04-27 MED ORDER — HEPARIN SODIUM (PORCINE) 5000 UNIT/ML IJ SOLN
5000.0000 [IU] | Freq: Three times a day (TID) | INTRAMUSCULAR | Status: DC
  Administered 2024-04-27 – 2024-04-28 (×3): 5000 [IU] via SUBCUTANEOUS
  Filled 2024-04-27 (×3): qty 1

## 2024-04-27 MED ORDER — NICOTINE 21 MG/24HR TD PT24
21.0000 mg | MEDICATED_PATCH | Freq: Every day | TRANSDERMAL | Status: DC | PRN
  Administered 2024-04-28 – 2024-04-30 (×3): 21 mg via TRANSDERMAL
  Filled 2024-04-27 (×3): qty 1

## 2024-04-27 MED ORDER — ACETAMINOPHEN 500 MG PO TABS
1000.0000 mg | ORAL_TABLET | Freq: Once | ORAL | Status: AC
  Administered 2024-04-27: 1000 mg via ORAL
  Filled 2024-04-27: qty 2

## 2024-04-27 MED ORDER — SENNOSIDES-DOCUSATE SODIUM 8.6-50 MG PO TABS: 1.0000 | ORAL_TABLET | Freq: Every evening | ORAL | Status: DC | PRN

## 2024-04-27 MED ORDER — SODIUM CHLORIDE 0.9 % IV SOLN
2.0000 g | Freq: Three times a day (TID) | INTRAVENOUS | Status: DC
  Administered 2024-04-27 – 2024-04-30 (×9): 2 g via INTRAVENOUS
  Filled 2024-04-27 (×10): qty 12.5

## 2024-04-27 MED ORDER — IOHEXOL 300 MG/ML  SOLN
100.0000 mL | Freq: Once | INTRAMUSCULAR | Status: AC | PRN
  Administered 2024-04-27: 100 mL via INTRAVENOUS

## 2024-04-27 MED ORDER — ACETAMINOPHEN 325 MG PO TABS
650.0000 mg | ORAL_TABLET | Freq: Four times a day (QID) | ORAL | Status: DC | PRN
  Administered 2024-04-28: 650 mg via ORAL
  Filled 2024-04-27: qty 2

## 2024-04-27 MED ORDER — MORPHINE SULFATE (PF) 2 MG/ML IV SOLN
2.0000 mg | INTRAVENOUS | Status: DC | PRN
  Administered 2024-04-27 – 2024-04-28 (×2): 2 mg via INTRAVENOUS
  Filled 2024-04-27 (×2): qty 1

## 2024-04-27 MED ORDER — TETANUS-DIPHTH-ACELL PERTUSSIS 5-2.5-18.5 LF-MCG/0.5 IM SUSY
0.5000 mL | PREFILLED_SYRINGE | Freq: Once | INTRAMUSCULAR | Status: AC
  Administered 2024-04-27: 0.5 mL via INTRAMUSCULAR
  Filled 2024-04-27: qty 0.5

## 2024-04-27 MED ORDER — VANCOMYCIN HCL 1750 MG/350ML IV SOLN
1750.0000 mg | Freq: Once | INTRAVENOUS | Status: AC
  Administered 2024-04-27: 1750 mg via INTRAVENOUS
  Filled 2024-04-27: qty 350

## 2024-04-27 MED ORDER — SODIUM CHLORIDE 0.9 % IV SOLN
1.0000 g | Freq: Once | INTRAVENOUS | Status: AC
  Administered 2024-04-27: 1 g via INTRAVENOUS
  Filled 2024-04-27: qty 10

## 2024-04-27 MED ORDER — ONDANSETRON HCL 4 MG/2ML IJ SOLN
4.0000 mg | Freq: Four times a day (QID) | INTRAMUSCULAR | Status: DC | PRN
  Administered 2024-04-28: 4 mg via INTRAVENOUS
  Filled 2024-04-27: qty 2

## 2024-04-27 NOTE — Assessment & Plan Note (Signed)
 Failed outpatient therapy Patient does not know what antibiotic she was taking outpatient however did state that she completed the medication Blood cultures x 2 have been ordered and are pending collection Cefepime, vancomycin per pharmacy

## 2024-04-27 NOTE — Assessment & Plan Note (Addendum)
 With wound Left lower extremity ultrasound ordered by EDP was negative for DVT Wound care consulted

## 2024-04-27 NOTE — ED Notes (Signed)
Informed RN bed assigned 

## 2024-04-27 NOTE — Assessment & Plan Note (Signed)
 -  As needed nicotine patch ordered ?

## 2024-04-27 NOTE — ED Notes (Signed)
 Patient transported to CT

## 2024-04-27 NOTE — ED Triage Notes (Signed)
 Patient to ED via POV for a fall. Patient states she tripped going up the stairs. PT reports she fell 3 weeks ago. Seen by PCP and dx with a hematoma. PT reports the left calf has been bleeding intermittently since fall. Bleeding controlled at this time.

## 2024-04-27 NOTE — Progress Notes (Signed)
 PHARMACY - BRIEF ANTIBIOTIC NOTE   Pharmacy has received consult(s) for vancomycin from an ED provider. The patient's profile has been reviewed for ht/wt/allergies/indication/available labs.    One time order(s) placed for: vancomycin 1750mg   Further antibiotics/pharmacy consults should be ordered by admitting physician if indicated.                       Thank you,  Will M. Alva Jewels, PharmD Clinical Pharmacist 04/27/2024 2:12 PM

## 2024-04-27 NOTE — Consult Note (Signed)
 Pharmacy Antibiotic Note  ASSESSMENT: 63 y.o. female with PMH including HTN, HLD, obesity is presenting with cellulitis. Patient fell on the stairs a few weeks back and was subsequently diagnosed with leg hematoma with overlying blister, which she popped a couple days later. Area has worsened since then, with bleeding and oozing. Her PCP sent her to hospital with concern for infection. Sonographic imaging is unremarkable. She is afebrile with stable vital signs.  Pharmacy has been consulted to manage vancomycin dosing.  Patient measurements: Height: 5\' 2"  (157.5 cm) Weight: 81.2 kg (179 lb) IBW/kg (Calculated) : 50.1  Vital signs: Temp: 97.7 F (36.5 C) (05/14 1322) Temp Source: Oral (05/14 1322) BP: 147/116 (05/14 1322) Pulse Rate: 74 (05/14 1322) Recent Labs  Lab 04/27/24 1409  WBC 10.3  CREATININE 0.67   Estimated Creatinine Clearance: 72.9 mL/min (by C-G formula based on SCr of 0.67 mg/dL).  Allergies: Allergies  Allergen Reactions   Codeine    Tramadol Nausea And Vomiting    Antimicrobials this admission: Ceftriaxone 5/14 x 1 Cefepime 5/14 >> Vancomycin 5/14 >>  Dose adjustments this admission: N/A  Microbiology results: 5/14 BCx: in process  PLAN: Administer vancomycin 1750mg  IV once as a load followed by 1500mg  IV q24H thereafter eAUC 485, Cmax 36, Cmin 11 Scr 0.8, IBW, Vd 0.72 L/kg Follow up culture results to assess for antibiotic optimization. Monitor renal function to assess for any necessary antibiotic dosing changes.   Thank you for allowing pharmacy to be a part of this patient's care.  Will M. Alva Jewels, PharmD Clinical Pharmacist 04/27/2024 3:39 PM

## 2024-04-27 NOTE — Hospital Course (Signed)
 Ms. Jezabel Shaheed is a 63 year old female with history of hypertension, hyperlipidemia, obesity, presents emergency department for chief concerns of worsening left leg wound.  Patient fell 3 weeks ago and developed a hematoma and was prescribed antibiotic outpatient which she completed the course of.  She self incision the hematoma which then after that developed into swelling and redness.  Vitals in the ED showed temperature of 97.7, respiration rate 17, heart rate 74, blood pressure 147 over 116, SpO2 100% on room air.  Serum sodium is 140, potassium 4.6, chloride 106, bicarb 25, BUN of 14, serum creatinine 0.67, EGFR greater than 60, nonfasting blood glucose 104, WBC 10.3, hemoglobin 11.8, platelets of 505.  Lactic acid is 1.4.  ED treatment: Acetaminophen  1000 mg p.o. one-time dose, ceftriaxone 1 g IV one-time dose, Tdap Booster, vancomycin per pharmacy.

## 2024-04-27 NOTE — Consult Note (Addendum)
 WOC Nurse Consult Note: patient reportedly fell 3 weeks ago and developed a hematoma Reason for Consult: L leg wound  Wound type: full thickness r/t trauma with hematoma  Pressure Injury POA: NA  Measurement: see nursing flowsheet  Wound bed: 100% dark hemorrhagic material  Drainage (amount, consistency, odor) bleeding at times (no purulent drainage per MD note)  Periwound: erythema and mild edema  Dressing procedure/placement/frequency:  Cleanse L lower leg wound with NS, apply Xeroform gauze Timm Foot [phone]) to wound bed daily, cover with Telfa nonstick dressing and ABD pad.  Secure with Kerlix roll gauze anchored around foot.  Cover with Ace bandage wrapped in same fashion as Kerlix for light compression.  SOAK DRESSING IN NS IF STUCK TO WOUND BED FOR ATRAUMATIC REMOVAL.   POC discussed with bedside nurse. WOC team will not follow.  Patient will likely require ongoing management of this wound by ortho/wound care center.    Thank you,    Ronni Colace MSN, RN-BC, [employer] [phone]

## 2024-04-27 NOTE — ED Provider Notes (Signed)
 [name] Provider Note    Event Date/Time   First MD Initiated Contact with Patient 04/27/24 1347     (approximate)   History   Fall   HPI  Karen Dalton is a 63 y.o. female with history of hypertension and hyperlipidemia presenting with left leg bleeding.  Patient states that she tripped over some stairs 3 weeks ago, fell, had swelling to her left leg and a hematoma with an overlying blood blister that she ruptured 2 days later.  States that it has been continuously oozing.  Went to see her primary care doctor who was also concerned about infection and started her on antibiotics.  States that she complete her her course of antibiotics couple days ago, does not remember what it was.  States that the swelling to her lower extremity is improving but the redness around her ruptured blood blister and hematoma site has not improved, redness had tracked down to her feet.  She denies any pain on proportion, fever, chest pain, shortness of breath, nausea, vomiting, diarrhea, urinary symptoms, weakness, numbness.  Is able to weight-bear and ambulate.  On independent chart review, she sees orthopedic surgery for right shoulder pain for pain had tried oxycodone and hydrocodone  in the past, they state that she is not a candidate for opioid management.     Physical Exam   Triage Vital Signs: ED Triage Vitals [04/27/24 1322]  Encounter Vitals Group     BP (!) 147/116     Systolic BP Percentile      Diastolic BP Percentile      Pulse Rate 74     Resp 17     Temp 97.7 F (36.5 C)     Temp Source Oral     SpO2 100 %     Weight 179 lb (81.2 kg)     Height 5\' 2"  (1.575 m)     Head Circumference      Peak Flow      Pain Score 7     Pain Loc      Pain Education      Exclude from Growth Chart     Most recent vital signs: Vitals:   04/27/24 1322  BP: (!) 147/116  Pulse: 74  Resp: 17  Temp: 97.7 F (36.5 C)  SpO2: 100%     General: Awake, no distress.   CV:  Good peripheral perfusion.  Resp:  Normal effort.  Abd:  No distention.  Other:  Equal DP pulses bilaterally, her left lower extremity is slightly larger than the right, there is erythema to the left leg down to the dorsal foot.  There is swelling to her left lateral leg, her compartments are soft, overlying the swelling there is an open wound that is not actively bleeding, no purulent drainage.  Superficial abrasion to the distal lateral leg.  She has no focal weakness or numbness.  Steady ambulation and is weightbearing.  No palpable crepitus.   ED Results / Procedures / Treatments   Labs (all labs ordered are listed, but only abnormal results are displayed) Labs Reviewed  COMPREHENSIVE METABOLIC PANEL WITH GFR - Abnormal; Notable for the following components:      Result Value   Glucose, Bld 104 (*)    All other components within normal limits  CBC WITH DIFFERENTIAL/PLATELET - Abnormal; Notable for the following components:   RBC 3.80 (*)    Hemoglobin 11.8 (*)    Platelets 505 (*)    All  other components within normal limits  CULTURE, BLOOD (ROUTINE X 2)  CULTURE, BLOOD (ROUTINE X 2)  LACTIC ACID, PLASMA  LACTIC ACID, PLASMA  HIV ANTIBODY (ROUTINE TESTING W REFLEX)     RADIOLOGY On my independent interpretation, x-ray without obvious fracture   PROCEDURES:  Critical Care performed: No  Procedures   MEDICATIONS ORDERED IN ED: Medications  vancomycin (VANCOREADY) IVPB 1750 mg/350 mL (1,750 mg Intravenous New Bag/Given 04/27/24 1451)  heparin injection 5,000 Units (has no administration in time range)  senna-docusate (Senokot-S) tablet 1 tablet (has no administration in time range)  acetaminophen  (TYLENOL ) tablet 650 mg (has no administration in time range)    Or  acetaminophen  (TYLENOL ) suppository 650 mg (has no administration in time range)  ondansetron  (ZOFRAN ) tablet 4 mg (has no administration in time range)    Or  ondansetron  (ZOFRAN ) injection 4 mg (has  no administration in time range)  acetaminophen  (TYLENOL ) tablet 1,000 mg (1,000 mg Oral Given 04/27/24 1356)  cefTRIAXone (ROCEPHIN) 1 g in sodium chloride 0.9 % 100 mL IVPB (0 g Intravenous Stopped 04/27/24 1451)  Tdap (BOOSTRIX) injection 0.5 mL (0.5 mLs Intramuscular Given 04/27/24 1418)     IMPRESSION / MDM / ASSESSMENT AND PLAN / ED COURSE  I reviewed the triage vital signs and the nursing notes.                              Differential diagnosis includes, but is not limited to, cellulitis, failed p.o. antibiotics, no pain on proportion, crepitus to suggest deep space infection, slow healing ruptured blood blister, slowly resolving hematoma, considered fracture, DVT.  Will get labs, x-ray tib-fib, DVT ultrasound.  Will start her on IV antibiotics and plan have her admitted for further management.  Patient's presentation is most consistent with acute presentation with potential threat to life or bodily function.  Independent interpretation of labs and imaging below.  Consult to hospitalist is agreeable with plan for admission will evaluate the patient.  She is admitted.  The patient is on the cardiac monitor to evaluate for evidence of arrhythmia and/or significant heart rate changes.   Clinical Course as of 04/27/24 1513  Wed Apr 27, 2024  1443 DG Tibia/Fibula Left Negative [TT]  1454 US  Venous Img Lower Unilateral Left No evidence of deep venous thrombosis.  [TT]  1501 Independent review of labs, lactate is normal, no leukocytosis, electrolytes not severely deranged, LFTs are normal. [TT]    Clinical Course User Index [TT] Drenda Gentle, Richard Champion, MD     FINAL CLINICAL IMPRESSION(S) / ED DIAGNOSES   Final diagnoses:  Cellulitis of left lower extremity  Hematoma  Localized swelling of left lower extremity  Blood blister  Abrasion     Rx / DC Orders   ED Discharge Orders     None        Note:  This document was prepared using Dragon voice recognition software and may  include unintentional dictation errors.    Shane Darling, MD 04/27/24 [phone]

## 2024-04-27 NOTE — H&P (Signed)
 History and Physical   Karen Dalton ZOX:[id number] DOB: 03-28-1961 DOA: 04/27/2024  PCP: Evelena Hines, FNP (Inactive)  Patient coming from: Home  I have personally briefly reviewed patient's old medical records in [facility] EMR.  Chief Concern: Left leg wound  HPI: [name] is a 63 year old female with history of hypertension, hyperlipidemia, obesity, presents emergency department for chief concerns of worsening left leg wound.  Patient fell 3 weeks ago and developed a hematoma and was prescribed antibiotic outpatient which she completed the course of.  She self incision the hematoma which then after that developed into swelling and redness.  Vitals in the ED showed temperature of 97.7, respiration rate 17, heart rate 74, blood pressure 147 over 116, SpO2 100% on room air.  Serum sodium is 140, potassium 4.6, chloride 106, bicarb 25, BUN of 14, serum creatinine 0.67, EGFR greater than 60, nonfasting blood glucose 104, WBC 10.3, hemoglobin 11.8, platelets of 505.  Lactic acid is 1.4.  ED treatment: Acetaminophen  1000 mg p.o. one-time dose, ceftriaxone 1 g IV one-time dose, Tdap Booster, vancomycin per pharmacy. --------------------------------- At bedside, patient was able to tell me her first and last name, age, location, current calendar year. She reports that she self ruptured her blood blister 2 days ago and has been oozing since.  She reports she does not remember the antibiotic in which she was prescribed and completed.   She did not have fever, chills, chest pain, shortness of breath, dysuria, hematuria, diarrhea, abdominal pain.    Social history: She lives at home.  She endorses smoking 3 to 4 cigarettes/day.  She endorses infrequent EtOH use.  She denies recreational drug use.  She is currently without a job at this time.  ROS: Constitutional: no weight change, no fever ENT/Mouth: no sore throat, no rhinorrhea Eyes: no eye pain, no vision  changes Cardiovascular: no chest pain, no dyspnea,  no edema, no palpitations Respiratory: no cough, no sputum, no wheezing Gastrointestinal: no nausea, no vomiting, no diarrhea, no constipation Genitourinary: no urinary incontinence, no dysuria, no hematuria Musculoskeletal: no arthralgias, no myalgias Skin: + skin lesions, no pruritus, Neuro: no weakness, no loss of consciousness, no syncope Psych: no anxiety, no depression, no decrease appetite Heme/Lymph: no bruising, no bleeding  ED Course: Discussed with EDP, patient requiring hospitalization for chief concerns of cellulitis failing outpatient therapy.  Assessment/Plan  Principal Problem:   Cellulitis Active Problems:   Essential hypertension   Hyperlipidemia   Left leg swelling   Obesity (BMI 30-39.9)   Tobacco use   Assessment and Plan:  * Cellulitis Failed outpatient therapy Patient does not know what antibiotic she was taking outpatient however did state that she completed the medication Blood cultures x 2 have been ordered and are pending collection Cefepime, vancomycin per pharmacy  Tobacco use As needed nicotine patch ordered  Left leg swelling With wound Left lower extremity ultrasound ordered by EDP was negative for DVT Wound care consulted  Essential hypertension Hydralazine 5 mg IV every 6 hours as needed for SBP > 160, 5 days ordered  Chart reviewed.   DVT prophylaxis: Heparin 5000 units subcutaneous every 8 hours Code Status: Full code Diet: Regular diet Family Communication: Updated father at bedside with patient's permission Disposition Plan: Pending clinical course Consults called: Pharmacy Admission status: MedSurg, observation  Past Medical History:  Diagnosis Date   Hypertension    Past Surgical History:  Procedure Laterality Date   ECTOPIC PREGNANCY SURGERY     TONSILLECTOMY  Social History:  reports that she has been smoking. She does not have any smokeless tobacco history on  file. She reports that she does not drink alcohol. No history on file for drug use.  Allergies  Allergen Reactions   Codeine    Tramadol Nausea And Vomiting   Family History  Problem Relation Age of Onset   Hypertension Father    Family history: Family history reviewed and not pertinent.  Prior to Admission medications   Medication Sig Start Date End Date Taking? Authorizing Provider  DULoxetine (CYMBALTA) 30 MG capsule Take 30 mg by mouth daily.    [provider]  methocarbamol  (ROBAXIN ) 500 MG tablet Take 1 tablet (500 mg total) by mouth every 6 (six) hours as needed for muscle spasms. 06/19/21   Stafford Eagles, PA-C  ondansetron  (ZOFRAN  ODT) 4 MG disintegrating tablet Take 1 tablet (4 mg total) by mouth every 8 (eight) hours as needed for nausea or vomiting. 06/19/21   Stafford Eagles, PA-C   Physical Exam: Vitals:   04/27/24 1322 04/27/24 1658  BP: (!) 147/116 (!) 167/80  Pulse: 74 75  Resp: 17 18  Temp: 97.7 F (36.5 C)   TempSrc: Oral   SpO2: 100% 100%  Weight: 81.2 kg   Height: 5\' 2"  (1.575 m)    Constitutional: appears age-appropriate, NAD, calm Eyes: PERRL, lids and conjunctivae normal ENMT: Mucous membranes are moist. Posterior pharynx clear of any exudate or lesions. Age-appropriate dentition. Hearing appropriate Neck: normal, supple, no masses, no thyromegaly Respiratory: clear to auscultation bilaterally, no wheezing, no crackles. Normal respiratory effort. No accessory muscle use.  Cardiovascular: Regular rate and rhythm, no murmurs / rubs / gallops. No extremity edema. 2+ pedal pulses. No carotid bruits.  Abdomen: no tenderness, no masses palpated, no hepatosplenomegaly. Bowel sounds positive.  Musculoskeletal: no clubbing / cyanosis. No joint deformity upper and lower extremities. Good ROM, no contractures, no atrophy. Normal muscle tone.  Skin: + left leg wound Neurologic: Sensation intact. Strength 5/5 in all 4.  Psychiatric: Normal judgment and  insight. Alert and oriented x 3. Normal mood.   EKG: Not indicated at this time  X-ray on Admission: I personally reviewed and I agree with radiologist reading as below.  US  Venous Img Lower Unilateral Left Result Date: 04/27/2024 CLINICAL DATA:  Swelling for 3 weeks with pain EXAM: LEFT LOWER EXTREMITY VENOUS DOPPLER ULTRASOUND TECHNIQUE: Gray-scale sonography with graded compression, as well as color Doppler and duplex ultrasound were performed to evaluate the lower extremity deep venous systems from the level of the common femoral vein and including the common femoral, femoral, profunda femoral, popliteal and calf veins including the posterior tibial, peroneal and gastrocnemius veins when visible. Spectral Doppler was utilized to evaluate flow at rest and with distal augmentation maneuvers in the common femoral, femoral and popliteal veins. COMPARISON:  None Available. FINDINGS: Contralateral Common Femoral Vein: Respiratory phasicity is normal and symmetric with the symptomatic side. No evidence of thrombus. Normal compressibility. Common Femoral Vein: No evidence of thrombus. Normal compressibility, respiratory phasicity and response to augmentation. Saphenofemoral Junction: No evidence of thrombus. Normal compressibility and flow on color Doppler imaging. Profunda Femoral Vein: No evidence of thrombus. Normal compressibility and flow on color Doppler imaging. Femoral Vein: No evidence of thrombus. Normal compressibility, respiratory phasicity and response to augmentation. Popliteal Vein: No evidence of thrombus. Normal compressibility, respiratory phasicity and response to augmentation. Calf Veins: No evidence of thrombus. Normal compressibility and flow on color Doppler imaging. IMPRESSION: No evidence of  deep venous thrombosis. Electronically Signed   By: Melven Stable.  Shick M.D.   On: 04/27/2024 14:42   DG Tibia/Fibula Left Result Date: 04/27/2024 CLINICAL DATA:  Status post fall EXAM: LEFT TIBIA AND FIBULA  - 2 VIEW COMPARISON:  None Available. FINDINGS: There is no evidence of fracture or other focal bone lesions. Soft tissues are unremarkable. IMPRESSION: Negative. Electronically Signed   By: Fredrich Jefferson M.D.   On: 04/27/2024 14:28   Labs on Admission: I have personally reviewed following labs  CBC: Recent Labs  Lab 04/27/24 1409  WBC 10.3  NEUTROABS 7.2  HGB 11.8*  HCT 36.0  MCV 94.7  PLT 505*   Basic Metabolic Panel: Recent Labs  Lab 04/27/24 1409  NA 140  K 4.6  CL 106  CO2 25  GLUCOSE 104*  BUN 14  CREATININE 0.67  CALCIUM 9.1   GFR: Estimated Creatinine Clearance: 72.9 mL/min (by C-G formula based on SCr of 0.67 mg/dL).  Liver Function Tests: Recent Labs  Lab 04/27/24 1409  AST 28  ALT 20  ALKPHOS 74  BILITOT 0.9  PROT 7.3  ALBUMIN 3.6   Urine analysis:    Component Value Date/Time   COLORURINE Yellow 07/26/2014 1237   APPEARANCEUR Clear 07/26/2014 1237   LABSPEC 1.017 07/26/2014 1237   PHURINE 6.0 07/26/2014 1237   GLUCOSEU Negative 07/26/2014 1237   HGBUR 1+ 07/26/2014 1237   BILIRUBINUR Negative 07/26/2014 1237   KETONESUR Trace 07/26/2014 1237   PROTEINUR Negative 07/26/2014 1237   NITRITE Negative 07/26/2014 1237   LEUKOCYTESUR Negative 07/26/2014 1237   This document was prepared using Dragon Voice Recognition software and may include unintentional dictation errors.  Dr. Reinhold Carbine [facility]  If 7PM-7AM, please contact overnight-coverage provider If 7AM-7PM, please contact day attending provider [email or web address]  04/27/2024, 5:45 PM

## 2024-04-27 NOTE — Progress Notes (Signed)
 Messaged PT nurse in ED addressing BP before handoff. Nurse stated patient is currently in CT and will recheck BP when they return.

## 2024-04-27 NOTE — ED Notes (Signed)
 Wound to L lateral lower leg cleaned with saline/peroxide solution. Covered with petroleum gauze and non-adherent pad. Secured with kerlix and tape.

## 2024-04-27 NOTE — Assessment & Plan Note (Addendum)
 Hydralazine 5 mg IV every 6 hours as needed for SBP >160, 5 days ordered

## 2024-04-28 ENCOUNTER — Observation Stay: Payer: Self-pay

## 2024-04-28 DIAGNOSIS — S81802S Unspecified open wound, left lower leg, sequela: Secondary | ICD-10-CM

## 2024-04-28 DIAGNOSIS — I10 Essential (primary) hypertension: Secondary | ICD-10-CM

## 2024-04-28 DIAGNOSIS — E785 Hyperlipidemia, unspecified: Secondary | ICD-10-CM

## 2024-04-28 DIAGNOSIS — M7989 Other specified soft tissue disorders: Secondary | ICD-10-CM

## 2024-04-28 DIAGNOSIS — E669 Obesity, unspecified: Secondary | ICD-10-CM

## 2024-04-28 DIAGNOSIS — Z720 Tobacco use: Secondary | ICD-10-CM

## 2024-04-28 LAB — MRSA NEXT GEN BY PCR, NASAL: MRSA by PCR Next Gen: NOT DETECTED

## 2024-04-28 LAB — BASIC METABOLIC PANEL WITH GFR
Anion gap: 10 (ref 5–15)
BUN: 17 mg/dL (ref 8–23)
CO2: 24 mmol/L (ref 22–32)
Calcium: 8.8 mg/dL — ABNORMAL LOW (ref 8.9–10.3)
Chloride: 106 mmol/L (ref 98–111)
Creatinine, Ser: 0.6 mg/dL (ref 0.44–1.00)
GFR, Estimated: >60 mL/min (ref >60)
Glucose, Bld: 108 mg/dL — ABNORMAL HIGH (ref 70–99)
Potassium: 3.7 mmol/L (ref 3.5–5.1)
Sodium: 140 mmol/L (ref 135–145)

## 2024-04-28 LAB — CBC
HCT: 34.8 % — ABNORMAL LOW (ref 36.0–46.0)
Hemoglobin: 11.2 g/dL — ABNORMAL LOW (ref 12.0–15.0)
MCH: 31 pg (ref 26.0–34.0)
MCHC: 32.2 g/dL (ref 30.0–36.0)
MCV: 96.4 fL (ref 80.0–100.0)
Platelets: 423 10*3/uL — ABNORMAL HIGH (ref 150–400)
RBC: 3.61 MIL/uL — ABNORMAL LOW (ref 3.87–5.11)
RDW: 13.6 % (ref 11.5–15.5)
WBC: 11.6 10*3/uL — ABNORMAL HIGH (ref 4.0–10.5)
nRBC: 0 % (ref 0.0–0.2)

## 2024-04-28 MED ORDER — HYDROCODONE-ACETAMINOPHEN 5-325 MG PO TABS
1.0000 | ORAL_TABLET | Freq: Four times a day (QID) | ORAL | Status: DC | PRN
  Administered 2024-04-28 – 2024-04-30 (×6): 1 via ORAL
  Filled 2024-04-28 (×7): qty 1

## 2024-04-28 MED ORDER — ALBUTEROL SULFATE (2.5 MG/3ML) 0.083% IN NEBU: 2.5000 mg | INHALATION_SOLUTION | RESPIRATORY_TRACT | Status: DC | PRN

## 2024-04-28 MED ORDER — CELECOXIB 200 MG PO CAPS
200.0000 mg | ORAL_CAPSULE | Freq: Every day | ORAL | Status: DC | PRN
  Administered 2024-04-28: 200 mg via ORAL
  Filled 2024-04-28: qty 1

## 2024-04-28 MED ORDER — POLYETHYLENE GLYCOL 3350 17 G PO PACK
17.0000 g | PACK | Freq: Two times a day (BID) | ORAL | Status: DC
  Administered 2024-04-28 (×2): 17 g via ORAL
  Filled 2024-04-28 (×4): qty 1

## 2024-04-28 MED ORDER — ATORVASTATIN CALCIUM 20 MG PO TABS
20.0000 mg | ORAL_TABLET | Freq: Every day | ORAL | Status: DC
Start: 2024-04-28 — End: 2024-04-30
  Administered 2024-04-28 – 2024-04-29 (×2): 20 mg via ORAL
  Filled 2024-04-28 (×3): qty 1

## 2024-04-28 MED ORDER — DULOXETINE HCL 30 MG PO CPEP
30.0000 mg | ORAL_CAPSULE | Freq: Two times a day (BID) | ORAL | Status: DC
  Administered 2024-04-28 – 2024-04-30 (×4): 30 mg via ORAL
  Filled 2024-04-28 (×4): qty 1

## 2024-04-28 MED ORDER — DULOXETINE HCL 30 MG PO CPEP
30.0000 mg | ORAL_CAPSULE | Freq: Every day | ORAL | Status: DC
  Administered 2024-04-28: 30 mg via ORAL
  Filled 2024-04-28: qty 1

## 2024-04-28 MED ORDER — HYDROCHLOROTHIAZIDE 12.5 MG PO TABS
12.5000 mg | ORAL_TABLET | Freq: Every day | ORAL | Status: DC
  Administered 2024-04-28 – 2024-04-30 (×3): 12.5 mg via ORAL
  Filled 2024-04-28 (×3): qty 1

## 2024-04-28 MED ORDER — FUROSEMIDE 20 MG PO TABS
20.0000 mg | ORAL_TABLET | Freq: Every day | ORAL | Status: DC
  Administered 2024-04-28 – 2024-04-30 (×3): 20 mg via ORAL
  Filled 2024-04-28 (×3): qty 1

## 2024-04-28 MED ORDER — MORPHINE SULFATE (PF) 2 MG/ML IV SOLN: 2.0000 mg | INTRAVENOUS | Status: DC | PRN

## 2024-04-28 NOTE — Progress Notes (Signed)
 PROGRESS NOTE    Karen Dalton  MWU:[id number] DOB: May 20, 1961 DOA: 04/27/2024 PCP: Evelena Hines, FNP (Inactive)  Chief Complaint  Patient presents with   [facility] Course:  Karen Dalton is a 63 year old female with hyperlipidemia, hypertension, obesity, who presents to the ED with worsening left leg wound.  Patient reports she fell 3 weeks prior to arrival and developed a hematoma.  She was prescribed an antibiotic outpatient and she completed this course.  Subsequently self ruptured for hematoma, and then developed swelling and redness around this area. In the ED vital signs stable.  Labs mostly unremarkable.  Patient was started on ceftriaxone, vancomycin, given Tdap booster and admitted for further management.   Subjective: No acute events overnight.  No significant leg swelling this morning, some pain.   Objective: Vitals:   04/27/24 1806 04/27/24 2024 04/28/24 0441 04/28/24 0808  BP: (!) 152/77 129/68 (!) 143/70 (!) 154/94  Pulse: 64 61 65 67  Resp: 17 18 20 17   Temp: 98.1 F (36.7 C) 98.5 F (36.9 C) 98.5 F (36.9 C) 98.4 F (36.9 C)  TempSrc:  Oral Oral Oral  SpO2: 97% 99% 99% 99%  Weight:      Height:        Intake/Output Summary (Last 24 hours) at 04/28/2024 0919 Last data filed at 04/27/2024 1734 Gross per 24 hour  Intake 450 ml  Output --  Net 450 ml   Filed Weights   04/27/24 1322  Weight: 81.2 kg    Examination: General exam: Appears calm and comfortable, NAD  Respiratory system: No work of breathing, symmetric chest wall expansion Cardiovascular system: S1 & S2 heard, RRR.  Gastrointestinal system: Abdomen is nondistended, soft and nontender.  Neuro: Alert and oriented. No focal neurological deficits. Extremities: Left leg freshly bandaged, c/D/I.  No erythema extending beyond bandage, no swelling Skin: No rashes, lesions Psychiatry: Demonstrates appropriate judgement and insight. Mood & affect appropriate for situation.       Assessment & Plan:  Principal Problem:   Cellulitis Active Problems:   Essential hypertension   Hyperlipidemia   Left leg swelling   Obesity (BMI 30-39.9)   Tobacco use    Cellulitis Left leg swelling - Hematoma after fall, with superimposed bacterial infection.  Patient reports she completed outpatient antibiotic course - Continue with cefepime and vancomycin for now - Patient completed course of Keflex outpatient prior to arrival - MRSA PCR ordered and pending - CT tib-fib: No acute fracture.  + Osteoarthritis.  2.2 x 5.1 x 10.6 hematoma noted no definitive active bleeding.  No subcutaneous air to suggest infection. - Doppler negative for DVT - Will obtain arterial Dopplers to rule out PAD - Blood cultures have been taken, will follow. - Trend CBC.  Leukocytosis 11.6, Hgb sable. - Follow fever curve - As needed pain control - Local wound care  Patient endorses chronic body swelling. - Home meds include Lasix refilled as recently as 4/29.  Patient reports she is not on this medication. - Denies history of heart failure.  No echo available on chart review - Will order echo - Lower extremities do not appear significantly edematous today  Tobacco abuse - 3 to 4 cigarettes daily - Nicotine patch ordered  Lipidemia - Continue statin  Hypertension - Resume home meds  Depression - Resume home meds  BMI 32 Obesity - Outpatient follow up for lifestyle modification and risk factor management   DVT prophylaxis: Hold off on chemical anticoagulation given oozing hematoma.  SCD on right leg only   Code Status: Full Code Disposition: Admit inpatient.  Currently necessitating IV antibiotics.  Consultants:    Procedures:    Antimicrobials:  Anti-infectives (From admission, onward)    Start     Dose/Rate Route Frequency Ordered Stop   04/28/24 1400  vancomycin (VANCOREADY) IVPB 1500 mg/300 mL        1,500 mg 150 mL/hr over 120 Minutes Intravenous Every 24  hours 04/27/24 1538 05/04/24 1359   04/27/24 1600  ceFEPIme (MAXIPIME) 2 g in sodium chloride 0.9 % 100 mL IVPB        2 g 200 mL/hr over 30 Minutes Intravenous Every 8 hours 04/27/24 1538 05/04/24 1559   04/27/24 1445  vancomycin (VANCOREADY) IVPB 1750 mg/350 mL        1,750 mg 175 mL/hr over 120 Minutes Intravenous  Once 04/27/24 1411 04/27/24 1645   04/27/24 1415  cefTRIAXone (ROCEPHIN) 1 g in sodium chloride 0.9 % 100 mL IVPB        1 g 200 mL/hr over 30 Minutes Intravenous  Once 04/27/24 1403 04/27/24 1451       Data Reviewed: I have personally reviewed following labs and imaging studies CBC: Recent Labs  Lab 04/27/24 1409 04/28/24 0343  WBC 10.3 11.6*  NEUTROABS 7.2  --   HGB 11.8* 11.2*  HCT 36.0 34.8*  MCV 94.7 96.4  PLT 505* 423*   Basic Metabolic Panel: Recent Labs  Lab 04/27/24 1409 04/28/24 0343  NA 140 140  K 4.6 3.7  CL 106 106  CO2 25 24  GLUCOSE 104* 108*  BUN 14 17  CREATININE 0.67 0.60  CALCIUM 9.1 8.8*   GFR: Estimated Creatinine Clearance: 72.9 mL/min (by C-G formula based on SCr of 0.6 mg/dL). Liver Function Tests: Recent Labs  Lab 04/27/24 1409  AST 28  ALT 20  ALKPHOS 74  BILITOT 0.9  PROT 7.3  ALBUMIN 3.6   CBG: No results for input(s): "GLUCAP" in the last 168 hours.  Recent Results (from the past 240 hours)  Culture, blood (routine x 2)     Status: None (Preliminary result)   Collection Time: 04/27/24  2:09 PM   Specimen: BLOOD  Result Value Ref Range Status   Specimen Description BLOOD BLOOD RIGHT WRIST  Final   Special Requests   Final    BOTTLES DRAWN AEROBIC AND ANAEROBIC Blood Culture results may not be optimal due to an inadequate volume of blood received in culture bottles   Culture   Final    NO GROWTH < 24 HOURS Performed at [facility], [address], [place], [place] [address]    Report Status PENDING  Incomplete  Culture, blood (routine x 2)     Status: None (Preliminary result)   Collection Time:  04/27/24  8:03 PM   Specimen: BLOOD  Result Value Ref Range Status   Specimen Description BLOOD BLOOD LEFT ARM LW  Final   Special Requests   Final    IN BOTH AEROBIC AND ANAEROBIC BOTTLES Blood Culture results may not be optimal due to an inadequate volume of blood received in culture bottles   Culture   Final    NO GROWTH < 12 HOURS Performed at [facility], [address] [place], [place] [address]    Report Status PENDING  Incomplete     Radiology Studies: CT TIBIA FIBULA LEFT W CONTRAST Result Date: 04/27/2024 CLINICAL DATA:  Soft tissue infection suspected, lower leg below-the-knee with visual  deformity. Please assess for gas or abscess. Tripped going up the stairs 3 weeks ago. Diagnosed by PCP with a hematoma. Patient reports the left calf has been bleeding intermittently since the fall. EXAM: CT OF THE LOWER LEFT EXTREMITY WITH CONTRAST TECHNIQUE: Multidetector CT imaging of the lower left extremity was performed according to the standard protocol following intravenous contrast administration. RADIATION DOSE REDUCTION: This exam was performed according to the departmental dose-optimization program which includes automated exposure control, adjustment of the mA and/or kV according to patient size and/or use of iterative reconstruction technique. CONTRAST:  OMNIPAQUE IOHEXOL 300 MG/ML  SOLN COMPARISON:  Left tibia and fibula radiographs 04/27/2024 FINDINGS: Bones/Joint/Cartilage No acute fracture. The cortices are intact. Mild patellofemoral osteoarthritis including joint space narrowing and peripheral osteophytosis. Mild medial compartment of the knee joint space narrowing and subchondral sclerosis. Ligaments Suboptimally assessed by CT. Muscles and Tendons Normal density and size of the regional musculature. No tendon tear is visualized. Soft tissues There is irregularity of the skin surface of the anterolateral shin at the level of the mid to distal aspect of the tibial  diaphysis consistent with reported wound (axial series 5, image 130 and coronal series 8, image 40). Within the subcutaneous fat in this region there is moderate swelling and there is a dense, heterogeneous collection most suggestive of a hematoma of blood products of varying age. This measures up to approximately 2.2 x 5.1 x 10.6 cm (transverse by AP by craniocaudal). There are very small s therea is a region of more dense blood within the hematoma within the posterior, lateral, superficial, slightly inferior aspect of the hematoma (axial series 5, image 132). It is difficult to exclude some contrast in this region, however otherwise no definitive actively bleeding vessel is visualized. There is mild anterolateral shin and anterior ankle skin thickening. More mild posterior and medial left calf subcutaneous fat edema. Nonspecific ossicle measuring up to 9 mm within the deep aspect of the subcutaneous fat of the anteromedial knee, just superficial to the medial patellofemoral retinaculum (axial series 5, image 32 and coronal series 8, image 19), nonspecific. This likely represents the sequela of remote trauma. No subcutaneous air. IMPRESSION: 1. Reported wound of the skin surface of the anterolateral shin at the level of the mid to distal aspect of the tibial diaphysis. Within the underlying subcutaneous fat there is a dense dense, heterogeneous collection most suggestive of a hematoma of blood products of varying age. This measures up to approximately 2.2 x 5.1 x 10.6 cm. No subcutaneous air or air within this collection to specifically suggest CT evidence of an infection within this collection. 2. No acute fracture. 3. Mild patellofemoral and medial compartment of the knee osteoarthritis. Electronically Signed   By: Bertina Broccoli M.D.   On: 04/27/2024 17:51   US  Venous Img Lower Unilateral Left Result Date: 04/27/2024 CLINICAL DATA:  Swelling for 3 weeks with pain EXAM: LEFT LOWER EXTREMITY VENOUS DOPPLER  ULTRASOUND TECHNIQUE: Gray-scale sonography with graded compression, as well as color Doppler and duplex ultrasound were performed to evaluate the lower extremity deep venous systems from the level of the common femoral vein and including the common femoral, femoral, profunda femoral, popliteal and calf veins including the posterior tibial, peroneal and gastrocnemius veins when visible. Spectral Doppler was utilized to evaluate flow at rest and with distal augmentation maneuvers in the common femoral, femoral and popliteal veins. COMPARISON:  None Available. FINDINGS: Contralateral Common Femoral Vein: Respiratory phasicity is normal and symmetric with the symptomatic  side. No evidence of thrombus. Normal compressibility. Common Femoral Vein: No evidence of thrombus. Normal compressibility, respiratory phasicity and response to augmentation. Saphenofemoral Junction: No evidence of thrombus. Normal compressibility and flow on color Doppler imaging. Profunda Femoral Vein: No evidence of thrombus. Normal compressibility and flow on color Doppler imaging. Femoral Vein: No evidence of thrombus. Normal compressibility, respiratory phasicity and response to augmentation. Popliteal Vein: No evidence of thrombus. Normal compressibility, respiratory phasicity and response to augmentation. Calf Veins: No evidence of thrombus. Normal compressibility and flow on color Doppler imaging. IMPRESSION: No evidence of deep venous thrombosis. Electronically Signed   By: Melven Stable.  Shick M.D.   On: 04/27/2024 14:42   DG Tibia/Fibula Left Result Date: 04/27/2024 CLINICAL DATA:  Status post fall EXAM: LEFT TIBIA AND FIBULA - 2 VIEW COMPARISON:  None Available. FINDINGS: There is no evidence of fracture or other focal bone lesions. Soft tissues are unremarkable. IMPRESSION: Negative. Electronically Signed   By: Fredrich Jefferson M.D.   On: 04/27/2024 14:28    Scheduled Meds:  heparin  5,000 Units Subcutaneous Q8H   Continuous Infusions:   ceFEPime (MAXIPIME) IV 2 g (04/28/24 0015)   vancomycin       LOS: 0 days  MDM: Patient is high risk for one or more organ failure.  They necessitate ongoing hospitalization for continued IV therapies and subsequent lab monitoring. Total time spent interpreting labs and vitals, reviewing the medical record, coordinating care amongst consultants and care team members, directly assessing and discussing care with the patient and/or family: 55 min  Lakedra Washington, DO [facility]  To contact the attending physician between 7A-7P please use Epic Chat. To contact the covering physician during after hours 7P-7A, please review Amion.  04/28/2024, 9:19 AM   *This document has been created with the assistance of dictation software. Please excuse typographical errors. *

## 2024-04-28 NOTE — Plan of Care (Signed)

## 2024-04-29 ENCOUNTER — Inpatient Hospital Stay (HOSPITAL_COMMUNITY)
Admit: 2024-04-29 | Discharge: 2024-04-29 | Disposition: A | Discharge status: Home / Self Care | Payer: Self-pay | Attending: Family Medicine | Admitting: Family Medicine

## 2024-04-29 DIAGNOSIS — I5031 Acute diastolic (congestive) heart failure: Secondary | ICD-10-CM

## 2024-04-29 LAB — ECHOCARDIOGRAM COMPLETE
AR max vel: 2.79 cm2
AV Area VTI: 3.53 cm2
AV Area mean vel: 3.08 cm2
AV Mean grad: 5 mmHg
AV Peak grad: 11.6 mmHg
Ao pk vel: 1.7 m/s
Area-P 1/2: 3.48 cm2
Height: 62 in
MV VTI: 3.07 cm2
S' Lateral: 2.7 cm
Weight: 2864 [oz_av]

## 2024-04-29 LAB — CREATININE, SERUM
Creatinine, Ser: 0.49 mg/dL (ref 0.44–1.00)
GFR, Estimated: >60 mL/min (ref >60)

## 2024-04-29 NOTE — Plan of Care (Signed)

## 2024-04-29 NOTE — Progress Notes (Signed)
 PROGRESS NOTE    Karen Dalton  MWN:[id number] DOB: August 16, 1961 DOA: 04/27/2024 PCP: Evelena Hines, FNP (Inactive)  Chief Complaint  Patient presents with   [facility] Course:  Karen Dalton is a 63 year old female with hyperlipidemia, hypertension, obesity, who presents to the ED with worsening left leg wound.  Patient reports she fell 3 weeks prior to arrival and developed a hematoma.  She was prescribed an antibiotic outpatient and she completed this course.  Subsequently self ruptured for hematoma, and then developed swelling and redness around this area. In the ED vital signs stable.  Labs mostly unremarkable.  Patient was started on ceftriaxone, vancomycin, given Tdap booster and admitted for further management.   Subjective: No acute events overnight.  Patient reports she is starting to feel better.  Still has some pain but it is improving.  Objective: Vitals:   04/28/24 1615 04/28/24 2023 04/29/24 0508 04/29/24 0715  BP: 127/61 (!) 149/81 (!) 151/71 (!) 151/80  Pulse: 66 65 64 62  Resp: 15 20 20 17   Temp: 98 F (36.7 C) 99 F (37.2 C) 98.2 F (36.8 C) 98.4 F (36.9 C)  TempSrc: Oral     SpO2: 100% 98% 98% 97%  Weight:      Height:        Intake/Output Summary (Last 24 hours) at 04/29/2024 0818 Last data filed at 04/29/2024 0617 Gross per 24 hour  Intake 850 ml  Output --  Net 850 ml   Filed Weights   04/27/24 1322  Weight: 81.2 kg    Examination: General exam: Appears calm and comfortable, NAD  Respiratory system: No work of breathing, symmetric chest wall expansion Cardiovascular system: S1 & S2 heard, RRR.  Gastrointestinal system: Abdomen is nondistended, soft and nontender.  Neuro: Alert and oriented. No focal neurological deficits. Extremities: Leg wound with dark black eschar, some surrounding erythema, granular tissue at perimeter of wound. Skin: No rashes, lesions Psychiatry: Demonstrates appropriate judgement and insight. Mood & affect  appropriate for situation.  \  Assessment & Plan:  Principal Problem:   Cellulitis Active Problems:   Essential hypertension   Hyperlipidemia   Left leg swelling   Obesity (BMI 30-39.9)   Tobacco use   Open leg wound, left, sequela    Cellulitis Left leg swelling - Hematoma after fall, with superimposed bacterial infection.  Patient reports she completed outpatient antibiotic course - Patient completed course of Keflex outpatient prior to arrival - MRSA PCR is negative - Patient has been on cefepime and vancomycin, CT scan without any evidence of subcutaneous air to suggest abscess.  Will discontinue further vancomycin.  Proceed with cefepime only - Dopplers negative for DVT - Arterial Doppler without evidence of PAD - Blood cultures have been taken, negative today, continue to follow - Trend CBC.  Hemoglobin currently stable - Follow fever curve, has been afebrile since arrival - Continue with as needed pain control - Local wound care, personally dressed wound at bedside today.  Wound RN consulted. - CT tib-fib: No acute fracture.  + Osteoarthritis.  2.2 x 5.1 x 10.6 hematoma noted no definitive active bleeding.  No subcutaneous air to suggest infection.  Patient endorses chronic body swelling. - Home meds include Lasix refilled as recently as 4/29.  Patient reports she is not on this medication. - Denies history of heart failure. - Echocardiogram here reveals preserved EF, no significant abnormalities  --resume home dose Lasix and HCTZ  Tobacco abuse - 3 to 4 cigarettes daily -  Nicotine patch ordered  Lipidemia - Continue statin  Hypertension - High this AM, better this afternoon - Titrate meds if she remains above goal for 24 hours - Elevated BPs may be secondary to pain - As needed hydralazine  Depression - Resume home meds  BMI 32 Obesity - Outpatient follow up for lifestyle modification and risk factor management   DVT prophylaxis: Hold off on chemical  anticoagulation given oozing hematoma.  SCD on right leg only   Code Status: Full Code Disposition: Admit inpatient.  Currently necessitating IV antibiotics.  Consultants:    Procedures:    Antimicrobials:  Anti-infectives (From admission, onward)    Start     Dose/Rate Route Frequency Ordered Stop   04/28/24 1400  vancomycin (VANCOREADY) IVPB 1500 mg/300 mL        1,500 mg 150 mL/hr over 120 Minutes Intravenous Every 24 hours 04/27/24 1538 05/04/24 1359   04/27/24 1600  ceFEPIme (MAXIPIME) 2 g in sodium chloride 0.9 % 100 mL IVPB        2 g 200 mL/hr over 30 Minutes Intravenous Every 8 hours 04/27/24 1538 05/04/24 1559   04/27/24 1445  vancomycin (VANCOREADY) IVPB 1750 mg/350 mL        1,750 mg 175 mL/hr over 120 Minutes Intravenous  Once 04/27/24 1411 04/27/24 1645   04/27/24 1415  cefTRIAXone (ROCEPHIN) 1 g in sodium chloride 0.9 % 100 mL IVPB        1 g 200 mL/hr over 30 Minutes Intravenous  Once 04/27/24 1403 04/27/24 1451       Data Reviewed: I have personally reviewed following labs and imaging studies CBC: Recent Labs  Lab 04/27/24 1409 04/28/24 0343  WBC 10.3 11.6*  NEUTROABS 7.2  --   HGB 11.8* 11.2*  HCT 36.0 34.8*  MCV 94.7 96.4  PLT 505* 423*   Basic Metabolic Panel: Recent Labs  Lab 04/27/24 1409 04/28/24 0343 04/29/24 0541  NA 140 140  --   K 4.6 3.7  --   CL 106 106  --   CO2 25 24  --   GLUCOSE 104* 108*  --   BUN 14 17  --   CREATININE 0.67 0.60 0.49  CALCIUM 9.1 8.8*  --    GFR: Estimated Creatinine Clearance: 72.9 mL/min (by C-G formula based on SCr of 0.49 mg/dL). Liver Function Tests: Recent Labs  Lab 04/27/24 1409  AST 28  ALT 20  ALKPHOS 74  BILITOT 0.9  PROT 7.3  ALBUMIN 3.6   CBG: No results for input(s): "GLUCAP" in the last 168 hours.  Recent Results (from the past 240 hours)  Culture, blood (routine x 2)     Status: None (Preliminary result)   Collection Time: 04/27/24  2:09 PM   Specimen: BLOOD  Result Value  Ref Range Status   Specimen Description BLOOD BLOOD RIGHT WRIST  Final   Special Requests   Final    BOTTLES DRAWN AEROBIC AND ANAEROBIC Blood Culture results may not be optimal due to an inadequate volume of blood received in culture bottles   Culture   Final    NO GROWTH 2 DAYS Performed at [facility], [address] [place], [place] [address]    Report Status PENDING  Incomplete  Culture, blood (routine x 2)     Status: None (Preliminary result)   Collection Time: 04/27/24  8:03 PM   Specimen: BLOOD  Result Value Ref Range Status   Specimen Description BLOOD BLOOD LEFT ARM LW  Final   Special Requests   Final    IN BOTH AEROBIC AND ANAEROBIC BOTTLES Blood Culture results may not be optimal due to an inadequate volume of blood received in culture bottles   Culture   Final    NO GROWTH 2 DAYS Performed at [facility], [address] [place], [place] [address]    Report Status PENDING  Incomplete  MRSA Next Gen by PCR, Nasal     Status: None   Collection Time: 04/28/24 10:13 PM   Specimen: Nasal Mucosa; Nasal Swab  Result Value Ref Range Status   MRSA by PCR Next Gen NOT DETECTED NOT DETECTED Final    Comment: (NOTE) The GeneXpert MRSA Assay (FDA approved for NASAL specimens only), is one component of a comprehensive MRSA colonization surveillance program. It is not intended to diagnose MRSA infection nor to guide or monitor treatment for MRSA infections. Test performance is not FDA approved in patients less than 61 years old. Performed at [facility], [address], [place], [place] [address]      Radiology Studies: US  ARTERIAL LOWER EXTREMITY DUPLEX LEFT (NON-ABI) Result Date: 04/28/2024 CLINICAL DATA:  63 year old female with left calf ulcer EXAM: LEFT LOWER EXTREMITY ARTERIAL DUPLEX SCAN TECHNIQUE: Gray-scale sonography as well as color Doppler and duplex ultrasound was performed to evaluate the lower extremity arteries including the  common, superficial and profunda femoral arteries, popliteal artery and calf arteries. COMPARISON:  None Available. FINDINGS: Left lower Extremity ABI: Not acquired Directed duplex of the left lower extremity demonstrates triphasic common femoral artery, triphasic profunda femoris. Triphasic superficial femoral artery and popliteal artery. Triphasic posterior tibial artery and dorsalis pedis. IMPRESSION: Directed duplex of the left lower extremity demonstrates waveforms maintained throughout. Signed, Marciano Settles. Rexine Cater, RPVI [facility] Radiology Electronically Signed   By: Myrlene Asper D.O.   On: 04/28/2024 16:01   CT TIBIA FIBULA LEFT W CONTRAST Result Date: 04/27/2024 CLINICAL DATA:  Soft tissue infection suspected, lower leg below-the-knee with visual deformity. Please assess for gas or abscess. Tripped going up the stairs 3 weeks ago. Diagnosed by PCP with a hematoma. Patient reports the left calf has been bleeding intermittently since the fall. EXAM: CT OF THE LOWER LEFT EXTREMITY WITH CONTRAST TECHNIQUE: Multidetector CT imaging of the lower left extremity was performed according to the standard protocol following intravenous contrast administration. RADIATION DOSE REDUCTION: This exam was performed according to the departmental dose-optimization program which includes automated exposure control, adjustment of the mA and/or kV according to patient size and/or use of iterative reconstruction technique. CONTRAST:  OMNIPAQUE IOHEXOL 300 MG/ML  SOLN COMPARISON:  Left tibia and fibula radiographs 04/27/2024 FINDINGS: Bones/Joint/Cartilage No acute fracture. The cortices are intact. Mild patellofemoral osteoarthritis including joint space narrowing and peripheral osteophytosis. Mild medial compartment of the knee joint space narrowing and subchondral sclerosis. Ligaments Suboptimally assessed by CT. Muscles and Tendons Normal density and size of the  regional musculature. No tendon tear is visualized. Soft tissues There is irregularity of the skin surface of the anterolateral shin at the level of the mid to distal aspect of the tibial diaphysis consistent with reported wound (axial series 5, image 130 and coronal series 8, image 40). Within the subcutaneous fat in this region there is moderate swelling and there is a dense, heterogeneous collection most suggestive of a hematoma of blood products of varying age. This measures up to approximately 2.2 x 5.1 x 10.6 cm (transverse by AP by  craniocaudal). There are very small s therea is a region of more dense blood within the hematoma within the posterior, lateral, superficial, slightly inferior aspect of the hematoma (axial series 5, image 132). It is difficult to exclude some contrast in this region, however otherwise no definitive actively bleeding vessel is visualized. There is mild anterolateral shin and anterior ankle skin thickening. More mild posterior and medial left calf subcutaneous fat edema. Nonspecific ossicle measuring up to 9 mm within the deep aspect of the subcutaneous fat of the anteromedial knee, just superficial to the medial patellofemoral retinaculum (axial series 5, image 32 and coronal series 8, image 19), nonspecific. This likely represents the sequela of remote trauma. No subcutaneous air. IMPRESSION: 1. Reported wound of the skin surface of the anterolateral shin at the level of the mid to distal aspect of the tibial diaphysis. Within the underlying subcutaneous fat there is a dense dense, heterogeneous collection most suggestive of a hematoma of blood products of varying age. This measures up to approximately 2.2 x 5.1 x 10.6 cm. No subcutaneous air or air within this collection to specifically suggest CT evidence of an infection within this collection. 2. No acute fracture. 3. Mild patellofemoral and medial compartment of the knee osteoarthritis. Electronically Signed   By: Bertina Broccoli  M.D.   On: 04/27/2024 17:51   US  Venous Img Lower Unilateral Left Result Date: 04/27/2024 CLINICAL DATA:  Swelling for 3 weeks with pain EXAM: LEFT LOWER EXTREMITY VENOUS DOPPLER ULTRASOUND TECHNIQUE: Gray-scale sonography with graded compression, as well as color Doppler and duplex ultrasound were performed to evaluate the lower extremity deep venous systems from the level of the common femoral vein and including the common femoral, femoral, profunda femoral, popliteal and calf veins including the posterior tibial, peroneal and gastrocnemius veins when visible. Spectral Doppler was utilized to evaluate flow at rest and with distal augmentation maneuvers in the common femoral, femoral and popliteal veins. COMPARISON:  None Available. FINDINGS: Contralateral Common Femoral Vein: Respiratory phasicity is normal and symmetric with the symptomatic side. No evidence of thrombus. Normal compressibility. Common Femoral Vein: No evidence of thrombus. Normal compressibility, respiratory phasicity and response to augmentation. Saphenofemoral Junction: No evidence of thrombus. Normal compressibility and flow on color Doppler imaging. Profunda Femoral Vein: No evidence of thrombus. Normal compressibility and flow on color Doppler imaging. Femoral Vein: No evidence of thrombus. Normal compressibility, respiratory phasicity and response to augmentation. Popliteal Vein: No evidence of thrombus. Normal compressibility, respiratory phasicity and response to augmentation. Calf Veins: No evidence of thrombus. Normal compressibility and flow on color Doppler imaging. IMPRESSION: No evidence of deep venous thrombosis. Electronically Signed   By: Melven Stable.  Shick M.D.   On: 04/27/2024 14:42   DG Tibia/Fibula Left Result Date: 04/27/2024 CLINICAL DATA:  Status post fall EXAM: LEFT TIBIA AND FIBULA - 2 VIEW COMPARISON:  None Available. FINDINGS: There is no evidence of fracture or other focal bone lesions. Soft tissues are unremarkable.  IMPRESSION: Negative. Electronically Signed   By: Fredrich Jefferson M.D.   On: 04/27/2024 14:28    Scheduled Meds:  atorvastatin  20 mg Oral Daily   DULoxetine  30 mg Oral BID   furosemide  20 mg Oral Daily   hydrochlorothiazide  12.5 mg Oral Daily   polyethylene glycol  17 g Oral BID   Continuous Infusions:  ceFEPime (MAXIPIME) IV Stopped (04/29/24 0127)   vancomycin 1,500 mg (04/28/24 1436)     LOS: 1 day  MDM: Patient is high risk for one  or more organ failure.  They necessitate ongoing hospitalization for continued IV therapies and subsequent lab monitoring. Total time spent interpreting labs and vitals, reviewing the medical record, coordinating care amongst consultants and care team members, directly assessing and discussing care with the patient and/or family: 55 min  Derry Arbogast, DO [facility]  To contact the attending physician between 7A-7P please use Epic Chat. To contact the covering physician during after hours 7P-7A, please review Amion.  04/29/2024, 8:18 AM   *This document has been created with the assistance of dictation software. Please excuse typographical errors. *

## 2024-04-29 NOTE — Progress Notes (Signed)
*  PRELIMINARY RESULTS* Echocardiogram 2D Echocardiogram has been performed.  Karen Dalton 04/29/2024, 8:42 AM

## 2024-04-29 NOTE — Consult Note (Addendum)
 WOC Nurse Consult Note: reconsulted for  L lower leg wound post trauma; ordered Xeroform at that time for drying antibacterial effects; see original consult note 04/27/2024  Reason for Consult: L leg wound  Wound type: full thickness r/t trauma with resultant hematoma  Pressure Injury POA: NA  Measurement: see nursing flowsheet  Wound bed: forming eschar tissue but still largely hemorrhagic per MD  Drainage (amount, consistency, odor)  Periwound: Dressing procedure/placement/frequency:  Continue with Xeroform daily for now. Patient plans to discharge home and would benefit from ongoing management of wound at wound care center vs orthopedics vs general surgery.   POC discussed with primary MD and bedside nurse.    Thank you,    Ronni Colace MSN, RN-BC, [employer] [phone]

## 2024-04-30 DIAGNOSIS — S8012XA Contusion of left lower leg, initial encounter: Secondary | ICD-10-CM

## 2024-04-30 LAB — COMPREHENSIVE METABOLIC PANEL WITH GFR
ALT: 25 U/L (ref 0–44)
AST: 22 U/L (ref 15–41)
Albumin: 3.2 g/dL — ABNORMAL LOW (ref 3.5–5.0)
Alkaline Phosphatase: 66 U/L (ref 38–126)
Anion gap: 8 (ref 5–15)
BUN: 18 mg/dL (ref 8–23)
CO2: 24 mmol/L (ref 22–32)
Calcium: 9.2 mg/dL (ref 8.9–10.3)
Chloride: 106 mmol/L (ref 98–111)
Creatinine, Ser: 0.48 mg/dL (ref 0.44–1.00)
GFR, Estimated: >60 mL/min (ref >60)
Glucose, Bld: 113 mg/dL — ABNORMAL HIGH (ref 70–99)
Potassium: 3.7 mmol/L (ref 3.5–5.1)
Sodium: 138 mmol/L (ref 135–145)
Total Bilirubin: 0.6 mg/dL (ref 0.0–1.2)
Total Protein: 6.7 g/dL (ref 6.5–8.1)

## 2024-04-30 LAB — CBC WITH DIFFERENTIAL/PLATELET
Abs Immature Granulocytes: 0.14 10*3/uL — ABNORMAL HIGH (ref 0.00–0.07)
Basophils Absolute: 0.1 10*3/uL (ref 0.0–0.1)
Basophils Relative: 1 %
Eosinophils Absolute: 0.3 10*3/uL (ref 0.0–0.5)
Eosinophils Relative: 2 %
HCT: 37 % (ref 36.0–46.0)
Hemoglobin: 12.3 g/dL (ref 12.0–15.0)
Immature Granulocytes: 1 %
Lymphocytes Relative: 17 %
Lymphs Abs: 1.8 10*3/uL (ref 0.7–4.0)
MCH: 31.2 pg (ref 26.0–34.0)
MCHC: 33.2 g/dL (ref 30.0–36.0)
MCV: 93.9 fL (ref 80.0–100.0)
Monocytes Absolute: 0.7 10*3/uL (ref 0.1–1.0)
Monocytes Relative: 7 %
Neutro Abs: 7.5 10*3/uL (ref 1.7–7.7)
Neutrophils Relative %: 72 %
Platelets: 457 10*3/uL — ABNORMAL HIGH (ref 150–400)
RBC: 3.94 MIL/uL (ref 3.87–5.11)
RDW: 13 % (ref 11.5–15.5)
WBC: 10.5 10*3/uL (ref 4.0–10.5)
nRBC: 0 % (ref 0.0–0.2)

## 2024-04-30 LAB — MAGNESIUM: Magnesium: 1.9 mg/dL (ref 1.7–2.4)

## 2024-04-30 LAB — PHOSPHORUS: Phosphorus: 3.5 mg/dL (ref 2.5–4.6)

## 2024-04-30 MED ORDER — HYDROCODONE-ACETAMINOPHEN 5-325 MG PO TABS: 1.0000 | ORAL_TABLET | Freq: Four times a day (QID) | ORAL | 0 refills | Status: AC | PRN

## 2024-04-30 MED ORDER — AMOXICILLIN-POT CLAVULANATE 875-125 MG PO TABS: 1.0000 | ORAL_TABLET | Freq: Two times a day (BID) | ORAL | 0 refills | Status: AC

## 2024-04-30 MED ORDER — NICOTINE 7 MG/24HR TD PT24: 7.0000 mg | MEDICATED_PATCH | TRANSDERMAL | 0 refills | Status: AC

## 2024-04-30 NOTE — Consult Note (Signed)
 Patient ID: Karen Dalton, female   DOB: 05-03-1961, 63 y.o.   MRN: [id number] CC: Left Leg Hematoma History of Present Illness Karen Dalton is a 63 y.o. female with past medical history as below who is seen in consultation for left lower leg wound.  The patient reports that approximately 4 weeks ago she fell and hit her leg.  She developed a gash on it.  Subsequently she reports that there was a blister over it.  She then popped the blister and said that there was expulsion of blood and serous fluid.  She says that she was treated with antibiotics at that time.  However the wound has persisted and she came  to the ED for evaluation.  She had a CT scan that showed the open wound and a likely hematoma but there was an inability to determine if there was contrast within this hematoma.  She reports that they have been wrapping it although it has not been very tight.  At home she says that she has been elevating her leg above her heart.  She says that she does have a little bit of pain at the area but no pain that radiates down to her leg and no change in sensation or motor function.  Past Medical History Past Medical History:  Diagnosis Date   Hypertension        Past Surgical History:  Procedure Laterality Date   ECTOPIC PREGNANCY SURGERY     TONSILLECTOMY      Allergies  Allergen Reactions   Codeine    Tramadol Nausea And Vomiting    Current Facility-Administered Medications  Medication Dose Route Frequency Provider Last Rate Last Admin   acetaminophen  (TYLENOL ) tablet 650 mg  650 mg Oral Q6H PRN Cox, Amy N, DO   650 mg at 04/28/24 4098   Or   acetaminophen  (TYLENOL ) suppository 650 mg  650 mg Rectal Q6H PRN Cox, Amy N, DO       albuterol  (PROVENTIL ) (2.5 MG/3ML) 0.083% nebulizer solution 2.5 mg  2.5 mg Inhalation Q4H PRN Dezii, Alexandra, DO       atorvastatin  (LIPITOR) tablet 20 mg  20 mg Oral Daily Dezii, Alexandra, DO   20 mg at 04/29/24 2138   ceFEPIme  (MAXIPIME ) 2 g in sodium  chloride 0.9 % 100 mL IVPB  2 g Intravenous Q8H Madelynn Schilder, RPH 200 mL/hr at 04/30/24 0823 2 g at 04/30/24 1191   celecoxib  (CELEBREX ) capsule 200 mg  200 mg Oral Daily PRN Dezii, Alexandra, DO   200 mg at 04/28/24 1426   DULoxetine  (CYMBALTA ) DR capsule 30 mg  30 mg Oral BID Dezii, Alexandra, DO   30 mg at 04/30/24 0818   furosemide  (LASIX ) tablet 20 mg  20 mg Oral Daily Dezii, Alexandra, DO   20 mg at 04/30/24 0818   hydrALAZINE  (APRESOLINE ) injection 5 mg  5 mg Intravenous Q6H PRN Cox, Amy N, DO       hydrochlorothiazide  (HYDRODIURIL ) tablet 12.5 mg  12.5 mg Oral Daily Dezii, Alexandra, DO   12.5 mg at 04/30/24 0818   HYDROcodone -acetaminophen  (NORCO/VICODIN) 5-325 MG per tablet 1 tablet  1 tablet Oral Q6H PRN Dezii, Alexandra, DO   1 tablet at 04/30/24 1123   melatonin tablet 5 mg  5 mg Oral QHS PRN Cox, Amy N, DO   5 mg at 04/29/24 2141   morphine  (PF) 2 MG/ML injection 2 mg  2 mg Intravenous Q4H PRN Dezii, Alexandra, DO       nicotine  (  NICODERM CQ  - dosed in mg/24 hours) patch 21 mg  21 mg Transdermal Daily PRN Cox, Amy N, DO   21 mg at 04/30/24 6045   ondansetron  (ZOFRAN ) tablet 4 mg  4 mg Oral Q6H PRN Cox, Amy N, DO   4 mg at 04/30/24 0818   Or   ondansetron  (ZOFRAN ) injection 4 mg  4 mg Intravenous Q6H PRN Cox, Amy N, DO   4 mg at 04/28/24 4098   polyethylene glycol (MIRALAX  / GLYCOLAX ) packet 17 g  17 g Oral BID Dezii, Alexandra, DO   17 g at 04/28/24 2150   senna-docusate (Senokot-S) tablet 1 tablet  1 tablet Oral QHS PRN Cox, Amy N, DO        Family History Family History  Problem Relation Age of Onset   Hypertension Father        Social History Social History   Tobacco Use   Smoking status: Every Day    Types: Cigarettes   Smokeless tobacco: Never  Substance Use Topics   Alcohol use: Yes   Drug use: Never        ROS Full ROS of systems performed and is otherwise negative there than what is stated in the HPI  Physical Exam Blood pressure (!) 148/77,  pulse 67, temperature 98.3 F (36.8 C), resp. rate 17, height 5\' 2"  (1.575 m), weight 81.2 kg, SpO2 98%.  Alert and oriented x 3, normal work of breathing on room air, regular rate and rhythm, abdomen soft, nontender nondistended, left lower extremity wound unwrapped.  There is an area of swelling with surrounding erythema.  This is soft and does not appear to be expanding, there is an overlying eschar.      Data Reviewed   I have personally reviewed the patient's imaging and medical records.    Assessment    I reviewed her labs she has no leukocytosis and her hemoglobin is within normal limit.  Independently.  We have reviewed her CT scan and there is evidence of an open wound with some underlying hematoma.  Plan    Patient with left lower extremity hematoma and surrounding cellulitis.  It does not appear to be expanding on my exam and there is no convincing evidence for active extravasation on her CT scan.  Right now recommend local wound care.  Agree with placing Xeroform over the eschar.  Placing Telfa over this and wrapping it with Kerlix and then Ace wrap.  I recommended that she continue to have it wrapped securely and tightly over the wound.  She should also raise her leg over the level of her heart to reduce the swelling.  Agree with treatment of cellulitis with antibiotics.  No surgical intervention at this time.  Surgery team will sign off and please notify us  if there is concern for abscess formation.  A total of 60 minutes was spent reviewing the patient's chart, performing a history and physical and discussing treatment options with the patient.  Barrett Lick 04/30/2024, 11:37 AM

## 2024-04-30 NOTE — Plan of Care (Signed)
  Problem: Education: Goal: Knowledge of General Education information will improve Description: Including pain rating scale, medication(s)/side effects and non-pharmacologic comfort measures Outcome: Progressing   Problem: Clinical Measurements: Goal: Diagnostic test results will improve Outcome: Progressing   Problem: Coping: Goal: Level of anxiety will decrease Outcome: Progressing   Problem: Safety: Goal: Ability to remain free from injury will improve Outcome: Progressing   Problem: Clinical Measurements: Goal: Ability to avoid or minimize complications of infection will improve Outcome: Progressing

## 2024-04-30 NOTE — Discharge Summary (Signed)
 Physician Discharge Summary   Patient: Karen Dalton MRN: [id number] DOB: Mar 26, 1961  Admit date:     04/27/2024  Discharge date: 04/30/24  Discharge Physician: Roise Cleaver   PCP: Evelena Hines, FNP (Inactive)   Recommendations at discharge:    Follow-up with primary care for chronic medication management Follow-up with wound care clinic for wound monitoring.  Discharge Diagnoses: Principal Problem:   Cellulitis Active Problems:   Essential hypertension   Hyperlipidemia   Left leg swelling   Obesity (BMI 30-39.9)   Tobacco use   Open leg wound, left, sequela  Resolved Problems:   * No resolved hospital problems. *  Hospital Course: Karen Dalton is a 63 year old female with hyperlipidemia, hypertension, obesity, presented to the ED with worsening left leg wound.  Patient sustained a fall 1 month prior to arrival and subsequently developed hematoma.  She developed cellulitis around the hematoma site and saw PCP where she was prescribed Keflex.  Despite antibiotic course area was becoming increasingly swollen and erythematous so she presented to the ED.  On arrival vital signs were stable, labs mostly unremarkable.  She was started on broad-spectrum antibiotics, given Tdap booster, and admitted for further management.  Wound was evaluated by general surgery during her stay who agrees with plan for local wound care, cellulitis antibiotics, and compression wrapping. Arterial Dopplers revealed no evidence of PAD.  Venous Dopplers negative for DVT.  Hemoglobin remained stable.  Patient was without fever. On 5/17 patient reported feeling better, pain better controlled with p.o. medications, and leg continues to improve.  She will be discharging home today with referral to wound care clinic.  Cellulitis Left leg swelling - Hematoma after fall, with superimposed bacterial infection.  Patient reports she completed outpatient antibiotic course - Patient completed course of Keflex  outpatient prior to arrival - MRSA PCR is negative - Patient has been on cefepime  and vancomycin , CT scan without any evidence of subcutaneous air to suggest abscess. - Proceed with Augmentin at discharge x 7 days - Wound care clinic consult - General Surgery agrees with plan for local wound care.  No acute surgical intervention necessary - Dopplers negative for DVT - Arterial Doppler without evidence of PAD - Cultures remain negative to date - Hemoglobin stable on CBC.  No leukocytosis.  No fever. - As needed Norco outpatient.  Allergy to codeine. - Wound care instructions provided at discharge - CT tib-fib: No acute fracture.  + Osteoarthritis.  2.2 x 5.1 x 10.6 hematoma noted no definitive active bleeding.  No subcutaneous air to suggest infection.   Patient endorses chronic body swelling. - Home meds include Lasix  refilled as recently as 4/29.  Patient reports she is not on this medication. - Denies history of heart failure. - Echocardiogram here reveals preserved EF, no significant abnormalities  --resume home dose Lasix  and HCTZ   Tobacco abuse - 3 to 4 cigarettes daily - Spent over 5 minutes on tobacco counseling cessation - Nicotine  patch ordered at discharge per patient request   Lipidemia - Continue statin   Hypertension - Continue home meds  Depression - Resume home meds   BMI 32 Obesity - Outpatient follow up for lifestyle modification and risk factor management  PDMP Reviewed Consultants: General Surgery Procedures performed: None Disposition: Home Diet recommendation:  Discharge Diet Orders (From admission, onward)     Start     Ordered   04/30/24 0000  Diet general        04/30/24 1338  Regular diet DISCHARGE MEDICATION: Allergies as of 04/30/2024       Reactions   Codeine    Tramadol Nausea And Vomiting        Medication List     STOP taking these medications    ondansetron  4 MG disintegrating tablet Commonly known as:  Zofran  ODT       TAKE these medications    albuterol  108 (90 Base) MCG/ACT inhaler Commonly known as: VENTOLIN  HFA Inhale 2 puffs into the lungs every 4 (four) hours as needed for shortness of breath or wheezing.   amoxicillin-clavulanate 875-125 MG tablet Commonly known as: AUGMENTIN Take 1 tablet by mouth 2 (two) times daily for 7 days.   atorvastatin  20 MG tablet Commonly known as: LIPITOR Take 20 mg by mouth daily.   celecoxib  200 MG capsule Commonly known as: CELEBREX  Take 200 mg by mouth daily as needed (fluid).   DULoxetine  30 MG capsule Commonly known as: CYMBALTA  Take 30 mg by mouth daily.   furosemide  20 MG tablet Commonly known as: LASIX  Take 20 mg by mouth daily.   hydrochlorothiazide  12.5 MG capsule Commonly known as: MICROZIDE  Take 12.5 mg by mouth daily.   HYDROcodone -acetaminophen  5-325 MG tablet Commonly known as: NORCO/VICODIN Take 1 tablet by mouth every 6 (six) hours as needed for up to 5 days for severe pain (pain score 7-10) (For pain not relieved by Tylenol  alone).   nicotine  7 mg/24hr patch Commonly known as: NICODERM CQ  - dosed in mg/24 hr Place 1 patch (7 mg total) onto the skin daily.               Discharge Care Instructions  (From admission, onward)           Start     Ordered   04/30/24 0000  Discharge wound care:       Comments: Change daily.  Apply thin layer of Vaseline followed by gauze, followed by tight Ace bandage to apply compression to the area.  Keep leg elevated.  Keep surrounding skin clean.   04/30/24 1338            Discharge Exam: Filed Weights   04/27/24 1322  Weight: 81.2 kg   Constitutional:  Normal appearance. Non toxic-appearing.  HENT: Head Normocephalic and atraumatic.  Mucous membranes are moist.  Eyes:  Extraocular intact. Conjunctivae normal. Pupils are equal, round, and reactive to light.  Cardiovascular: Rate and Rhythm: Normal rate and regular rhythm.  Pulmonary: Non labored,  symmetric rise of chest wall.  Musculoskeletal:  Normal range of motion.  Skin: Left leg freshly bandaged, bandages C/D/I.  Compressive Ace bandage also in place.  Leg elevated. Neurological: No focal deficit present. alert. Oriented. Psychiatric: Mood and Affect congruent.    Condition at discharge: stable  The results of significant diagnostics from this hospitalization (including imaging, microbiology, ancillary and laboratory) are listed below for reference.   Imaging Studies: ECHOCARDIOGRAM COMPLETE Result Date: 04/29/2024    ECHOCARDIOGRAM REPORT   Patient Name:   Karen Dalton Date of Exam: 04/29/2024 Medical Rec #:  [id number]    Height:       62.0 in Accession #:    [id number]   Weight:       179.0 lb Date of Birth:  24-May-1961    BSA:          1.824 m Patient Age:    62 years     BP:           151/80 mmHg Patient  Gender: F            HR:           62 bpm. Exam Location:  [facility] Procedure: 2D Echo, Cardiac Doppler, Color Doppler, Strain Analysis and 3D Echo            (Both Spectral and Color Flow Doppler were utilized during            procedure). Indications:     CHF-acute diastolic I50.31  History:         Patient has no prior history of Echocardiogram examinations.                  Risk Factors:Hypertension.  Sonographer:     Broadus Canes Referring Phys:  [id number] Roise Cleaver Diagnosing Phys: Constancia Delton MD  Sonographer Comments: Global longitudinal strain was attempted. IMPRESSIONS  1. Left ventricular ejection fraction, by estimation, is 65 to 70%. The left ventricle has normal function. The left ventricle has no regional wall motion abnormalities. There is mild left ventricular hypertrophy. Left ventricular diastolic parameters were normal.  2. Right ventricular systolic function is normal. The right ventricular size is normal.  3. The mitral valve is normal in structure. No evidence of mitral valve regurgitation.  4. The aortic valve was not well visualized. Aortic valve regurgitation  is not visualized.  5. The inferior vena cava is normal in size with greater than 50% respiratory variability, suggesting right atrial pressure of 3 mmHg. FINDINGS  Left Ventricle: Left ventricular ejection fraction, by estimation, is 65 to 70%. The left ventricle has normal function. The left ventricle has no regional wall motion abnormalities. Global longitudinal strain performed but not reported based on interpreter judgement due to suboptimal tracking. The left ventricular internal cavity size was normal in size. There is mild left ventricular hypertrophy. Left ventricular diastolic parameters were normal. Right Ventricle: The right ventricular size is normal. No increase in right ventricular wall thickness. Right ventricular systolic function is normal. Left Atrium: Left atrial size was normal in size. Right Atrium: Right atrial size was normal in size. Pericardium: There is no evidence of pericardial effusion. Mitral Valve: The mitral valve is normal in structure. No evidence of mitral valve regurgitation. MV peak gradient, 5.0 mmHg. The mean mitral valve gradient is 2.0 mmHg. Tricuspid Valve: The tricuspid valve is normal in structure. Tricuspid valve regurgitation is not demonstrated. Aortic Valve: The aortic valve was not well visualized. Aortic valve regurgitation is not visualized. Aortic valve mean gradient measures 5.0 mmHg. Aortic valve peak gradient measures 11.6 mmHg. Aortic valve area, by VTI measures 3.53 cm. Pulmonic Valve: The pulmonic valve was not well visualized. Pulmonic valve regurgitation is not visualized. Aorta: The aortic root is normal in size and structure. Venous: The inferior vena cava is normal in size with greater than 50% respiratory variability, suggesting right atrial pressure of 3 mmHg. IAS/Shunts: No atrial level shunt detected by color flow Doppler.  LEFT VENTRICLE PLAX 2D LVIDd:         4.00 cm   Diastology LVIDs:         2.70 cm   LV e' medial:    8.81 cm/s LV PW:          1.40 cm   LV E/e' medial:  11.7 LV IVS:        1.20 cm   LV e' lateral:   9.57 cm/s LVOT diam:     2.00 cm   LV E/e' lateral: 10.8 LV SV:  109 LV SV Index:   60 LVOT Area:     3.14 cm  RIGHT VENTRICLE RV Basal diam:  3.00 cm RV Mid diam:    2.40 cm LEFT ATRIUM             Index        RIGHT ATRIUM          Index LA diam:        2.50 cm 1.37 cm/m   RA Area:     8.26 cm LA Vol (A2C):   27.1 ml 14.86 ml/m  RA Volume:   16.20 ml 8.88 ml/m LA Vol (A4C):   18.8 ml 10.31 ml/m LA Biplane Vol: 24.5 ml 13.43 ml/m  AORTIC VALVE AV Area (Vmax):    2.79 cm AV Area (Vmean):   3.08 cm AV Area (VTI):     3.53 cm AV Vmax:           170.00 cm/s AV Vmean:          103.000 cm/s AV VTI:            0.308 m AV Peak Grad:      11.6 mmHg AV Mean Grad:      5.0 mmHg LVOT Vmax:         151.00 cm/s LVOT Vmean:        101.000 cm/s LVOT VTI:          0.346 m LVOT/AV VTI ratio: 1.12  AORTA Ao Root diam: 2.70 cm MITRAL VALVE                TRICUSPID VALVE MV Area (PHT): 3.48 cm     TR Peak grad:   13.0 mmHg MV Area VTI:   3.07 cm     TR Vmax:        180.00 cm/s MV Peak grad:  5.0 mmHg MV Mean grad:  2.0 mmHg     SHUNTS MV Vmax:       1.12 m/s     Systemic VTI:  0.35 m MV Vmean:      69.8 cm/s    Systemic Diam: 2.00 cm MV Decel Time: 218 msec MV E velocity: 103.00 cm/s MV A velocity: 94.90 cm/s MV E/A ratio:  1.09 Constancia Delton MD Electronically signed by Constancia Delton MD Signature Date/Time: 04/29/2024/1:22:02 PM    Final    US  ARTERIAL LOWER EXTREMITY DUPLEX LEFT (NON-ABI) Result Date: 04/28/2024 CLINICAL DATA:  63 year old female with left calf ulcer EXAM: LEFT LOWER EXTREMITY ARTERIAL DUPLEX SCAN TECHNIQUE: Gray-scale sonography as well as color Doppler and duplex ultrasound was performed to evaluate the lower extremity arteries including the common, superficial and profunda femoral arteries, popliteal artery and calf arteries. COMPARISON:  None Available. FINDINGS: Left lower Extremity ABI: Not acquired Directed  duplex of the left lower extremity demonstrates triphasic common femoral artery, triphasic profunda femoris. Triphasic superficial femoral artery and popliteal artery. Triphasic posterior tibial artery and dorsalis pedis. IMPRESSION: Directed duplex of the left lower extremity demonstrates waveforms maintained throughout. Signed, Marciano Settles. Rexine Cater, RPVI [facility] Electronically Signed   By: Myrlene Asper D.O.   On: 04/28/2024 16:01   CT TIBIA FIBULA LEFT W CONTRAST Result Date: 04/27/2024 CLINICAL DATA:  Soft tissue infection suspected, lower leg below-the-knee with visual deformity. Please assess for gas or abscess. Tripped going up the stairs 3 weeks ago. Diagnosed by PCP with a hematoma. Patient reports the left calf has been bleeding intermittently  since the fall. EXAM: CT OF THE LOWER LEFT EXTREMITY WITH CONTRAST TECHNIQUE: Multidetector CT imaging of the lower left extremity was performed according to the standard protocol following intravenous contrast administration. RADIATION DOSE REDUCTION: This exam was performed according to the departmental dose-optimization program which includes automated exposure control, adjustment of the mA and/or kV according to patient size and/or use of iterative reconstruction technique. CONTRAST:  OMNIPAQUE  IOHEXOL  300 MG/ML  SOLN COMPARISON:  Left tibia and fibula radiographs 04/27/2024 FINDINGS: Bones/Joint/Cartilage No acute fracture. The cortices are intact. Mild patellofemoral osteoarthritis including joint space narrowing and peripheral osteophytosis. Mild medial compartment of the knee joint space narrowing and subchondral sclerosis. Ligaments Suboptimally assessed by CT. Muscles and Tendons Normal density and size of the regional musculature. No tendon tear is visualized. Soft tissues There is irregularity of the skin surface of the anterolateral shin at the level of the mid to distal aspect  of the tibial diaphysis consistent with reported wound (axial series 5, image 130 and coronal series 8, image 40). Within the subcutaneous fat in this region there is moderate swelling and there is a dense, heterogeneous collection most suggestive of a hematoma of blood products of varying age. This measures up to approximately 2.2 x 5.1 x 10.6 cm (transverse by AP by craniocaudal). There are very small s therea is a region of more dense blood within the hematoma within the posterior, lateral, superficial, slightly inferior aspect of the hematoma (axial series 5, image 132). It is difficult to exclude some contrast in this region, however otherwise no definitive actively bleeding vessel is visualized. There is mild anterolateral shin and anterior ankle skin thickening. More mild posterior and medial left calf subcutaneous fat edema. Nonspecific ossicle measuring up to 9 mm within the deep aspect of the subcutaneous fat of the anteromedial knee, just superficial to the medial patellofemoral retinaculum (axial series 5, image 32 and coronal series 8, image 19), nonspecific. This likely represents the sequela of remote trauma. No subcutaneous air. IMPRESSION: 1. Reported wound of the skin surface of the anterolateral shin at the level of the mid to distal aspect of the tibial diaphysis. Within the underlying subcutaneous fat there is a dense dense, heterogeneous collection most suggestive of a hematoma of blood products of varying age. This measures up to approximately 2.2 x 5.1 x 10.6 cm. No subcutaneous air or air within this collection to specifically suggest CT evidence of an infection within this collection. 2. No acute fracture. 3. Mild patellofemoral and medial compartment of the knee osteoarthritis. Electronically Signed   By: Bertina Broccoli M.D.   On: 04/27/2024 17:51   US  Venous Img Lower Unilateral Left Result Date: 04/27/2024 CLINICAL DATA:  Swelling for 3 weeks with pain EXAM: LEFT LOWER EXTREMITY VENOUS  DOPPLER ULTRASOUND TECHNIQUE: Gray-scale sonography with graded compression, as well as color Doppler and duplex ultrasound were performed to evaluate the lower extremity deep venous systems from the level of the common femoral vein and including the common femoral, femoral, profunda femoral, popliteal and calf veins including the posterior tibial, peroneal and gastrocnemius veins when visible. Spectral Doppler was utilized to evaluate flow at rest and with distal augmentation maneuvers in the common femoral, femoral and popliteal veins. COMPARISON:  None Available. FINDINGS: Contralateral Common Femoral Vein: Respiratory phasicity is normal and symmetric with the symptomatic side. No evidence of thrombus. Normal compressibility. Common Femoral Vein: No evidence of thrombus. Normal compressibility, respiratory phasicity and response to augmentation. Saphenofemoral Junction: No evidence of thrombus. Normal compressibility  and flow on color Doppler imaging. Profunda Femoral Vein: No evidence of thrombus. Normal compressibility and flow on color Doppler imaging. Femoral Vein: No evidence of thrombus. Normal compressibility, respiratory phasicity and response to augmentation. Popliteal Vein: No evidence of thrombus. Normal compressibility, respiratory phasicity and response to augmentation. Calf Veins: No evidence of thrombus. Normal compressibility and flow on color Doppler imaging. IMPRESSION: No evidence of deep venous thrombosis. Electronically Signed   By: Melven Stable.  Shick M.D.   On: 04/27/2024 14:42   DG Tibia/Fibula Left Result Date: 04/27/2024 CLINICAL DATA:  Status post fall EXAM: LEFT TIBIA AND FIBULA - 2 VIEW COMPARISON:  None Available. FINDINGS: There is no evidence of fracture or other focal bone lesions. Soft tissues are unremarkable. IMPRESSION: Negative. Electronically Signed   By: Fredrich Jefferson M.D.   On: 04/27/2024 14:28    Microbiology: Results for orders placed or performed during the hospital  encounter of 04/27/24  Culture, blood (routine x 2)     Status: None (Preliminary result)   Collection Time: 04/27/24  2:09 PM   Specimen: BLOOD  Result Value Ref Range Status   Specimen Description BLOOD BLOOD RIGHT WRIST  Final   Special Requests   Final    BOTTLES DRAWN AEROBIC AND ANAEROBIC Blood Culture results may not be optimal due to an inadequate volume of blood received in culture bottles   Culture   Final    NO GROWTH 3 DAYS Performed at [facility], [address], [place], [place] [address]    Report Status PENDING  Incomplete  Culture, blood (routine x 2)     Status: None (Preliminary result)   Collection Time: 04/27/24  8:03 PM   Specimen: BLOOD  Result Value Ref Range Status   Specimen Description BLOOD BLOOD LEFT ARM LW  Final   Special Requests   Final    IN BOTH AEROBIC AND ANAEROBIC BOTTLES Blood Culture results may not be optimal due to an inadequate volume of blood received in culture bottles   Culture   Final    NO GROWTH 3 DAYS Performed at [facility], [address] [place], [place] [address]    Report Status PENDING  Incomplete  MRSA Next Gen by PCR, Nasal     Status: None   Collection Time: 04/28/24 10:13 PM   Specimen: Nasal Mucosa; Nasal Swab  Result Value Ref Range Status   MRSA by PCR Next Gen NOT DETECTED NOT DETECTED Final    Comment: (NOTE) The GeneXpert MRSA Assay (FDA approved for NASAL specimens only), is one component of a comprehensive MRSA colonization surveillance program. It is not intended to diagnose MRSA infection nor to guide or monitor treatment for MRSA infections. Test performance is not FDA approved in patients less than 61 years old. Performed at [facility], [address] [place], [place] [address]     Labs: CBC: Recent Labs  Lab 04/27/24 1409 04/28/24 0343 04/30/24 0442  WBC 10.3 11.6* 10.5  NEUTROABS 7.2  --  7.5  HGB 11.8* 11.2* 12.3  HCT 36.0 34.8* 37.0  MCV 94.7 96.4 93.9   PLT 505* 423* 457*   Basic Metabolic Panel: Recent Labs  Lab 04/27/24 1409 04/28/24 0343 04/29/24 0541 04/30/24 0442  NA 140 140  --  138  K 4.6 3.7  --  3.7  CL 106 106  --  106  CO2 25 24  --  24  GLUCOSE 104* 108*  --  113*  BUN 14 17  --  18  CREATININE 0.67 0.60 0.49 0.48  CALCIUM  9.1 8.8*  --  9.2  MG  --   --   --  1.9  PHOS  --   --   --  3.5   Liver Function Tests: Recent Labs  Lab 04/27/24 1409 04/30/24 0442  AST 28 22  ALT 20 25  ALKPHOS 74 66  BILITOT 0.9 0.6  PROT 7.3 6.7  ALBUMIN 3.6 3.2*   CBG: No results for input(s): "GLUCAP" in the last 168 hours.  Discharge time spent: 32 minutes.  Signed: Leshawn Houseworth, DO [facility] 04/30/2024

## 2024-05-02 LAB — CULTURE, BLOOD (ROUTINE X 2)
Culture: NO GROWTH
Culture: NO GROWTH

## 2024-05-12 ENCOUNTER — Encounter: Payer: Self-pay | Attending: Physician Assistant | Admitting: Physician Assistant

## 2024-05-12 DIAGNOSIS — L03116 Cellulitis of left lower limb: Secondary | ICD-10-CM | POA: Insufficient documentation

## 2024-05-12 DIAGNOSIS — I10 Essential (primary) hypertension: Secondary | ICD-10-CM | POA: Insufficient documentation

## 2024-05-12 DIAGNOSIS — W01118A Fall on same level from slipping, tripping and stumbling with subsequent striking against other sharp object, initial encounter: Secondary | ICD-10-CM | POA: Insufficient documentation

## 2024-05-12 DIAGNOSIS — S8012XA Contusion of left lower leg, initial encounter: Secondary | ICD-10-CM | POA: Insufficient documentation

## 2024-05-16 ENCOUNTER — Encounter: Payer: Self-pay | Attending: Physician Assistant | Admitting: Physician Assistant

## 2024-05-16 DIAGNOSIS — I10 Essential (primary) hypertension: Secondary | ICD-10-CM | POA: Insufficient documentation

## 2024-05-16 DIAGNOSIS — X58XXXA Exposure to other specified factors, initial encounter: Secondary | ICD-10-CM | POA: Insufficient documentation

## 2024-05-16 DIAGNOSIS — L03116 Cellulitis of left lower limb: Secondary | ICD-10-CM | POA: Insufficient documentation

## 2024-05-16 DIAGNOSIS — S8012XA Contusion of left lower leg, initial encounter: Secondary | ICD-10-CM | POA: Insufficient documentation

## 2024-05-16 DIAGNOSIS — L97822 Non-pressure chronic ulcer of other part of left lower leg with fat layer exposed: Secondary | ICD-10-CM | POA: Insufficient documentation

## 2024-05-18 ENCOUNTER — Ambulatory Visit: Payer: Self-pay | Admitting: Physician Assistant

## 2024-05-19 ENCOUNTER — Ambulatory Visit: Payer: Self-pay | Admitting: Physician Assistant

## 2024-05-23 ENCOUNTER — Encounter: Payer: Self-pay | Admitting: Physician Assistant

## 2024-06-03 ENCOUNTER — Ambulatory Visit: Payer: Self-pay

## 2024-06-08 ENCOUNTER — Encounter: Payer: Self-pay | Admitting: Physician Assistant

## 2024-06-13 ENCOUNTER — Encounter: Payer: Self-pay | Admitting: Physician Assistant

## 2024-06-20 ENCOUNTER — Encounter: Payer: Self-pay | Attending: Physician Assistant | Admitting: Physician Assistant

## 2024-06-20 DIAGNOSIS — L97822 Non-pressure chronic ulcer of other part of left lower leg with fat layer exposed: Secondary | ICD-10-CM | POA: Insufficient documentation

## 2024-06-20 DIAGNOSIS — L03116 Cellulitis of left lower limb: Secondary | ICD-10-CM | POA: Insufficient documentation

## 2024-06-20 DIAGNOSIS — I10 Essential (primary) hypertension: Secondary | ICD-10-CM | POA: Insufficient documentation

## 2024-06-20 DIAGNOSIS — S8012XA Contusion of left lower leg, initial encounter: Secondary | ICD-10-CM | POA: Insufficient documentation

## 2024-06-27 ENCOUNTER — Encounter: Payer: Self-pay | Admitting: Physician Assistant

## 2024-07-04 ENCOUNTER — Encounter: Payer: Self-pay | Admitting: Physician Assistant

## 2024-07-11 ENCOUNTER — Encounter: Payer: Self-pay | Admitting: Physician Assistant

## 2024-07-18 ENCOUNTER — Encounter: Payer: Self-pay | Attending: Physician Assistant | Admitting: Physician Assistant

## 2024-07-18 DIAGNOSIS — L97822 Non-pressure chronic ulcer of other part of left lower leg with fat layer exposed: Secondary | ICD-10-CM | POA: Insufficient documentation

## 2024-07-18 DIAGNOSIS — S8012XA Contusion of left lower leg, initial encounter: Secondary | ICD-10-CM | POA: Insufficient documentation

## 2024-07-18 DIAGNOSIS — L03116 Cellulitis of left lower limb: Secondary | ICD-10-CM | POA: Insufficient documentation

## 2024-07-18 DIAGNOSIS — W01198A Fall on same level from slipping, tripping and stumbling with subsequent striking against other object, initial encounter: Secondary | ICD-10-CM | POA: Insufficient documentation

## 2024-07-18 DIAGNOSIS — I10 Essential (primary) hypertension: Secondary | ICD-10-CM | POA: Insufficient documentation

## 2024-07-25 ENCOUNTER — Encounter: Payer: Self-pay | Admitting: Physician Assistant

## 2024-08-01 ENCOUNTER — Encounter: Payer: Self-pay | Admitting: Physician Assistant

## 2024-08-08 ENCOUNTER — Encounter: Payer: Self-pay | Admitting: Internal Medicine

## 2024-08-16 ENCOUNTER — Encounter: Payer: Self-pay | Attending: Physician Assistant | Admitting: Physician Assistant

## 2024-08-16 DIAGNOSIS — I10 Essential (primary) hypertension: Secondary | ICD-10-CM | POA: Insufficient documentation

## 2024-08-16 DIAGNOSIS — L97822 Non-pressure chronic ulcer of other part of left lower leg with fat layer exposed: Secondary | ICD-10-CM | POA: Insufficient documentation

## 2024-08-16 DIAGNOSIS — X58XXXA Exposure to other specified factors, initial encounter: Secondary | ICD-10-CM | POA: Insufficient documentation

## 2024-08-16 DIAGNOSIS — S8012XA Contusion of left lower leg, initial encounter: Secondary | ICD-10-CM | POA: Insufficient documentation

## 2024-08-16 DIAGNOSIS — L03116 Cellulitis of left lower limb: Secondary | ICD-10-CM | POA: Insufficient documentation

## 2024-08-22 ENCOUNTER — Encounter: Payer: Self-pay | Admitting: Physician Assistant

## 2024-08-29 ENCOUNTER — Encounter: Payer: Self-pay | Admitting: Physician Assistant

## 2024-09-05 ENCOUNTER — Encounter: Payer: Self-pay | Admitting: Physician Assistant

## 2024-09-12 ENCOUNTER — Ambulatory Visit: Payer: Self-pay | Admitting: Physician Assistant

## 2024-09-14 ENCOUNTER — Encounter: Payer: Self-pay | Attending: Physician Assistant | Admitting: Physician Assistant

## 2024-09-14 DIAGNOSIS — L97822 Non-pressure chronic ulcer of other part of left lower leg with fat layer exposed: Secondary | ICD-10-CM | POA: Insufficient documentation

## 2024-09-14 DIAGNOSIS — L03116 Cellulitis of left lower limb: Secondary | ICD-10-CM | POA: Insufficient documentation

## 2024-09-14 DIAGNOSIS — S8012XA Contusion of left lower leg, initial encounter: Secondary | ICD-10-CM | POA: Insufficient documentation

## 2024-09-14 DIAGNOSIS — I10 Essential (primary) hypertension: Secondary | ICD-10-CM | POA: Insufficient documentation

## 2024-09-20 ENCOUNTER — Encounter: Payer: Self-pay | Admitting: Physician Assistant

## 2024-09-29 ENCOUNTER — Encounter: Payer: Self-pay | Admitting: Physician Assistant

## 2024-10-10 ENCOUNTER — Emergency Department: Payer: Self-pay

## 2024-10-10 ENCOUNTER — Other Ambulatory Visit: Payer: Self-pay

## 2024-10-10 ENCOUNTER — Emergency Department
Admission: EM | Admit: 2024-10-10 | Discharge: 2024-10-10 | Disposition: A | Discharge status: Home / Self Care | Payer: Self-pay | Attending: Emergency Medicine | Admitting: Emergency Medicine

## 2024-10-10 DIAGNOSIS — Z79899 Other long term (current) drug therapy: Secondary | ICD-10-CM | POA: Insufficient documentation

## 2024-10-10 DIAGNOSIS — R110 Nausea: Secondary | ICD-10-CM | POA: Insufficient documentation

## 2024-10-10 DIAGNOSIS — I10 Essential (primary) hypertension: Secondary | ICD-10-CM | POA: Insufficient documentation

## 2024-10-10 DIAGNOSIS — Z87442 Personal history of urinary calculi: Secondary | ICD-10-CM | POA: Insufficient documentation

## 2024-10-10 DIAGNOSIS — R10A1 Flank pain, right side: Secondary | ICD-10-CM | POA: Insufficient documentation

## 2024-10-10 LAB — BASIC METABOLIC PANEL WITH GFR
Anion gap: 12 (ref 5–15)
BUN: 20 mg/dL (ref 8–23)
CO2: 24 mmol/L (ref 22–32)
Calcium: 9 mg/dL (ref 8.9–10.3)
Chloride: 105 mmol/L (ref 98–111)
Creatinine, Ser: 1.01 mg/dL — ABNORMAL HIGH (ref 0.44–1.00)
GFR, Estimated: >60 mL/min (ref >60)
Glucose, Bld: 102 mg/dL — ABNORMAL HIGH (ref 70–99)
Potassium: 3.5 mmol/L (ref 3.5–5.1)
Sodium: 141 mmol/L (ref 135–145)

## 2024-10-10 LAB — CBC
HCT: 38.9 % (ref 36.0–46.0)
Hemoglobin: 13 g/dL (ref 12.0–15.0)
MCH: 30.8 pg (ref 26.0–34.0)
MCHC: 33.4 g/dL (ref 30.0–36.0)
MCV: 92.2 fL (ref 80.0–100.0)
Platelets: 418 K/uL — ABNORMAL HIGH (ref 150–400)
RBC: 4.22 MIL/uL (ref 3.87–5.11)
RDW: 14.1 % (ref 11.5–15.5)
WBC: 10.6 K/uL — ABNORMAL HIGH (ref 4.0–10.5)
nRBC: 0 % (ref 0.0–0.2)

## 2024-10-10 LAB — URINALYSIS, ROUTINE W REFLEX MICROSCOPIC
Bilirubin Urine: NEGATIVE
Glucose, UA: NEGATIVE mg/dL
Hgb urine dipstick: NEGATIVE
Ketones, ur: NEGATIVE mg/dL
Leukocytes,Ua: NEGATIVE
Nitrite: NEGATIVE
Protein, ur: NEGATIVE mg/dL
Specific Gravity, Urine: 1.02 (ref 1.005–1.030)
pH: 5 (ref 5.0–8.0)

## 2024-10-10 LAB — HEPATIC FUNCTION PANEL
ALT: 22 U/L (ref 0–44)
AST: 25 U/L (ref 15–41)
Albumin: 3.9 g/dL (ref 3.5–5.0)
Alkaline Phosphatase: 87 U/L (ref 38–126)
Bilirubin, Direct: <0.1 mg/dL (ref 0.0–0.2)
Total Bilirubin: 0.5 mg/dL (ref 0.0–1.2)
Total Protein: 7.9 g/dL (ref 6.5–8.1)

## 2024-10-10 MED ORDER — MORPHINE SULFATE (PF) 4 MG/ML IV SOLN
4.0000 mg | Freq: Once | INTRAVENOUS | Status: AC
  Administered 2024-10-10: 4 mg via INTRAVENOUS
  Filled 2024-10-10: qty 1

## 2024-10-10 MED ORDER — HYDROCODONE-ACETAMINOPHEN 5-325 MG PO TABS: 1.0000 | ORAL_TABLET | Freq: Three times a day (TID) | ORAL | 0 refills | Status: AC | PRN

## 2024-10-10 MED ORDER — CYCLOBENZAPRINE HCL 5 MG PO TABS: 5.0000 mg | ORAL_TABLET | Freq: Three times a day (TID) | ORAL | 0 refills | Status: AC | PRN

## 2024-10-10 MED ORDER — SODIUM CHLORIDE 0.9 % IV BOLUS
1000.0000 mL | Freq: Once | INTRAVENOUS | Status: AC
  Administered 2024-10-10: 1000 mL via INTRAVENOUS

## 2024-10-10 MED ORDER — ONDANSETRON HCL 4 MG/2ML IJ SOLN
4.0000 mg | Freq: Once | INTRAMUSCULAR | Status: AC
  Administered 2024-10-10: 4 mg via INTRAVENOUS
  Filled 2024-10-10: qty 2

## 2024-10-10 MED ORDER — PREDNISONE 10 MG PO TABS: 10.0000 mg | ORAL_TABLET | Freq: Every day | ORAL | 0 refills | Status: AC

## 2024-10-10 NOTE — ED Provider Notes (Signed)
 [place] [facility]PARTMENT AT [facility] Provider Note   CSN: [id number] Arrival date & time: 10/10/24  1608     Patient presents with: Flank Pain   Karen Dalton is a 63 y.o. female kidney stones, hypertension, hyperlipidemia presents for evaluation of right flank pain.  Symptoms have been present for a couple of weeks.  No trauma or injury.  No fevers or chills.  Mild nausea.  She notes she saw PCP last week and had blood in her urine.  Flank pain has been increasing.  She has been taking Tylenol  and ibuprofen with little relief.  No abdominal pain, vaginal bleeding or discharge.  No painful urination. No chest pain or shortness of breath.        Prior to Admission medications   Medication Sig Start Date End Date Taking? Authorizing Provider  cyclobenzaprine (FLEXERIL) 5 MG tablet Take 1-2 tablets (5-10 mg total) by mouth 3 (three) times daily as needed. 10/10/24  Yes Charlene Ned BROCKS, PA-C  HYDROcodone -acetaminophen  (NORCO/VICODIN) 5-325 MG tablet Take 1 tablet by mouth every 8 (eight) hours as needed for severe pain (pain score 7-10). 10/10/24  Yes Charlene Ned BROCKS, PA-C  predniSONE (DELTASONE) 10 MG tablet Take 1 tablet (10 mg total) by mouth daily. 6,5,4,3,2,1 six day taper 10/10/24  Yes Elyssia, Strausser, PA-C  albuterol  (VENTOLIN  HFA) 108 (90 Base) MCG/ACT inhaler Inhale 2 puffs into the lungs every 4 (four) hours as needed for shortness of breath or wheezing.    [provider]  atorvastatin  (LIPITOR) 20 MG tablet Take 20 mg by mouth daily.    [provider]  celecoxib  (CELEBREX ) 200 MG capsule Take 200 mg by mouth daily as needed (fluid).    [provider]  DULoxetine  (CYMBALTA ) 30 MG capsule Take 30 mg by mouth daily.    [provider]  furosemide  (LASIX ) 20 MG tablet Take 20 mg by mouth daily.    [provider]  hydrochlorothiazide  (MICROZIDE ) 12.5 MG capsule Take 12.5 mg by mouth daily.    [provider]     Allergies: Codeine and Tramadol    Review of Systems  Updated Vital Signs BP 122/69 (BP Location: Right Arm)   Pulse 82   Temp 98.5 F (36.9 C) (Oral)   Resp 16   Ht 5' 2 (1.575 m)   Wt 77.1 kg   SpO2 99%   BMI 31.09 kg/m   Physical Exam Constitutional:      Appearance: She is well-developed.  HENT:     Head: Normocephalic and atraumatic.     Right Ear: External ear normal.     Left Ear: External ear normal.     Mouth/Throat:     Pharynx: Oropharynx is clear.  Eyes:     Extraocular Movements: Extraocular movements intact.     Conjunctiva/sclera: Conjunctivae normal.     Pupils: Pupils are equal, round, and reactive to light.  Cardiovascular:     Rate and Rhythm: Normal rate and regular rhythm.     Pulses: Normal pulses.  Pulmonary:     Effort: Pulmonary effort is normal. No respiratory distress.     Breath sounds: Normal breath sounds.  Abdominal:     General: Abdomen is flat. Bowel sounds are normal. There is no distension.     Palpations: Abdomen is soft.     Tenderness: There is no abdominal tenderness. There is right CVA tenderness. There is no left CVA tenderness or guarding.  Musculoskeletal:  General: Normal range of motion.     Cervical back: Normal range of motion.     Comments: Mild right lower rib tenderness and iliac crest tenderness  Skin:    General: Skin is warm.     Findings: No rash.  Neurological:     General: No focal deficit present.     Mental Status: She is alert and oriented to person, place, and time. Mental status is at baseline.  Psychiatric:        Behavior: Behavior normal.        Thought Content: Thought content normal.     (all labs ordered are listed, but only abnormal results are displayed) Labs Reviewed  URINALYSIS, ROUTINE W REFLEX MICROSCOPIC - Abnormal; Notable for the following components:      Result Value   Color, Urine YELLOW (*)    APPearance CLEAR (*)    All other components within normal limits   BASIC METABOLIC PANEL WITH GFR - Abnormal; Notable for the following components:   Glucose, Bld 102 (*)    Creatinine, Ser 1.01 (*)    All other components within normal limits  CBC - Abnormal; Notable for the following components:   WBC 10.6 (*)    Platelets 418 (*)    All other components within normal limits  HEPATIC FUNCTION PANEL    EKG: None  Radiology: CT Renal Stone Study Result Date: 10/10/2024 CLINICAL DATA:  Flank pain and hematuria. EXAM: CT ABDOMEN AND PELVIS WITHOUT CONTRAST TECHNIQUE: Multidetector CT imaging of the abdomen and pelvis was performed following the standard protocol without IV contrast. RADIATION DOSE REDUCTION: This exam was performed according to the departmental dose-optimization program which includes automated exposure control, adjustment of the mA and/or kV according to patient size and/or use of iterative reconstruction technique. COMPARISON:  January 17, 2011 FINDINGS: Lower chest: No acute abnormality. Hepatobiliary: 7 mm and 12 mm foci of parenchymal low attenuation are seen within the right lobe of the liver. A 2.7 cm diameter hepatic cyst is seen within the inferior aspect of the right lobe. No gallstones, gallbladder wall thickening, or biliary dilatation. Pancreas: Unremarkable. No pancreatic ductal dilatation or surrounding inflammatory changes. Spleen: Normal in size without focal abnormality. Adrenals/Urinary Tract: Adrenal glands are unremarkable. Kidneys are normal, without renal calculi, focal lesion, or hydronephrosis. Bladder is unremarkable. Stomach/Bowel: Stomach is within normal limits. The appendix is not clearly identified. No evidence of bowel wall thickening, distention, or inflammatory changes. Noninflamed diverticula are seen throughout the descending and sigmoid colon. Vascular/Lymphatic: Aortic atherosclerosis. No enlarged abdominal or pelvic lymph nodes. Reproductive: A 3.3 cm partially calcified heterogeneous uterine fibroid is seen.  The bilateral adnexa are unremarkable. Other: No abdominal wall hernia or abnormality. No abdominopelvic ascites. Musculoskeletal: No acute or significant osseous findings. IMPRESSION: 1. No evidence of renal calculi or hydronephrosis. 2. Colonic diverticulosis. 3. Hepatic cyst. 4. Uterine fibroid. 5. Aortic atherosclerosis. Electronically Signed   By: Suzen Dials M.D.   On: 10/10/2024 18:48     Procedures   Medications Ordered in the ED  sodium chloride  0.9 % bolus 1,000 mL (0 mLs Intravenous Stopped 10/10/24 2016)  morphine  (PF) 4 MG/ML injection 4 mg (4 mg Intravenous Given 10/10/24 1829)  ondansetron  (ZOFRAN ) injection 4 mg (4 mg Intravenous Given 10/10/24 1829)                                    Medical Decision Making  Amount and/or Complexity of Data Reviewed Labs: ordered. Radiology: ordered.  Risk Prescription drug management.   63 year old female with right flank pain.  Vital signs stable, afebrile.  Reported some hematuria last week but today urinalysis negative for any signs of infection or blood.  CBC and CMP within normal limits except for slightly elevated white count of 10.6.  CT renal negative for renal calculi.  Patient given prescription for muscle relaxer, prednisone and Norco.  She will take medications as prescribed and follow-up with primary care provider.  Her vital signs are stable afebrile nontachycardic with normal O2 sats. She understands signs and symptoms return to ER for.  Final diagnoses:  Right flank pain    ED Discharge Orders          Ordered    predniSONE (DELTASONE) 10 MG tablet  Daily        10/10/24 2154    HYDROcodone -acetaminophen  (NORCO/VICODIN) 5-325 MG tablet  Every 8 hours PRN        10/10/24 2154    cyclobenzaprine (FLEXERIL) 5 MG tablet  3 times daily PRN        10/10/24 2154               Gregery Walberg C, PA-C 10/10/24 2158    Arlander Charleston, MD 10/10/24 2200

## 2024-10-10 NOTE — ED Triage Notes (Signed)
 Pt to ED via POV for c/o right flank pain that has been ongoing for about three weeks. Pt states pain sometimes extends to left side. Pt saw MD on 14th and states that there was blood in her urine. MD prescribed muscle relaxer, but pt states they do not provide any relief.

## 2024-10-10 NOTE — Discharge Instructions (Signed)
 Please take medications as prescribed.  Call primary care provider to schedule follow-up in 1 to 2 days for recheck.  Return to the ER for any worsening symptoms such as fevers, increasing pain, vomiting, worsening symptoms or any urgent changes in your health

## 2025-01-02 ENCOUNTER — Ambulatory Visit

## 2025-01-04 ENCOUNTER — Ambulatory Visit

## 2025-01-10 ENCOUNTER — Ambulatory Visit (INDEPENDENT_AMBULATORY_CARE_PROVIDER_SITE_OTHER): Admitting: Physician Assistant

## 2025-01-10 ENCOUNTER — Ambulatory Visit

## 2025-01-10 VITALS — BP 137/73 | HR 72 | Ht 61.0 in | Wt 177.8 lb

## 2025-01-10 DIAGNOSIS — M1612 Unilateral primary osteoarthritis, left hip: Secondary | ICD-10-CM

## 2025-01-10 DIAGNOSIS — M25552 Pain in left hip: Secondary | ICD-10-CM | POA: Diagnosis not present

## 2025-01-10 NOTE — Progress Notes (Signed)
 "    Chief Complaint: Left hip pain    History of Present Illness:    Karen Dalton is a 64 y.o. female presents to [facility] with 17-month history of left hip pain.  Patient states begins in the middle of the buttocks, wraps around to the lateral aspect of her hip and then goes to her groin.  Began after a fall in mid April 2025, but has been gradually getting worse, most severe over the past few months.  Has taken prescription strength meloxicam and used over-the-counter ibuprofen 800 mg with minimal symptom improvement.  Reports associated leg weakness, but believes it is from severity of pain.  Denies shooting/radiating pain.  Denies leg paresthesias.  Patient has noticed popping and grinding in the groin.  Patient has been using single-point walking cane for ambulation assistance.  Patient is currently retired.  Found note in epic care everywhere from [facility]  Patient seen on 01/06/2024.  Reported 2 to 3-year history of low back pain and bilateral hip pain.  Underwent left hip MRI.  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  Patient was offered left hip total arthroplasty.  Most recent note from February 05, 2024, plan for left hip replacement, was awaiting insurance approval for hip surgery. Appears may have been a worker's comp injury.  Patient with no DM2 diagnosis. No significant cardiac history. Was managed by Dr. Andre Dustman PA-C with [facility] from May till Oct 2025 for left lower extremity slow healing wound with secondary cellulitis. Completed oral antibiotics. Now fully healed.     PMH/PSH/Social History/Family History/Allergies/Meds:   Past Medical History:  Diagnosis Date   Hypertension    Past Surgical History:  Procedure Laterality Date   ECTOPIC  PREGNANCY SURGERY     TONSILLECTOMY     Social History   Socioeconomic History   Marital status: Married    Spouse name: Not on file   Number of children: Not on file   Years of education: Not on file   Highest education level: Not on file  Occupational History   Not on file  Tobacco Use   Smoking status: Every Day    Types: Cigarettes   Smokeless tobacco: Never  Substance and Sexual Activity   Alcohol use: Yes   Drug use: Never   Sexual activity: Not Currently  Other Topics Concern   Not on file  Social History Narrative   Not on file   Social Drivers of Health   Tobacco Use: High Risk (10/10/2024)   Patient History    Smoking Tobacco Use: Every Day    Smokeless Tobacco Use: Never    Passive Exposure: Not on file  Financial Resource Strain: Not on file  Food Insecurity: No Food Insecurity (04/27/2024)   Hunger Vital Sign    Worried About Running Out of Food in the Last Year: Never true    Ran Out of Food in the Last Year: Never true  Transportation Needs: No Transportation Needs (04/27/2024)   PRAPARE - Administrator, Civil Service (Medical): No    Lack of Transportation (Non-Medical): No  Physical Activity: Not on file  Stress: Not on file  Social Connections: Not on file  Depression (EYV7-0): Not on file  Alcohol Screen: Not on file  Housing: Low Risk (04/27/2024)   Housing Stability Vital Sign  Unable to Pay for Housing in the Last Year: No    Number of Times Moved in the Last Year: 0    Homeless in the Last Year: No  Utilities: Not At Risk (04/27/2024)   AHC Utilities    Threatened with loss of utilities: No  Health Literacy: Not on file   Family History  Problem Relation Age of Onset   Hypertension Father    Allergies[1] Current Outpatient Medications  Medication Sig Dispense Refill   albuterol  (VENTOLIN  HFA) 108 (90 Base) MCG/ACT inhaler Inhale 2 puffs into the lungs every 4 (four) hours as needed for shortness of breath or wheezing.      atorvastatin  (LIPITOR) 20 MG tablet Take 20 mg by mouth daily.     celecoxib  (CELEBREX ) 200 MG capsule Take 200 mg by mouth daily as needed (fluid).     cyclobenzaprine  (FLEXERIL ) 5 MG tablet Take 1-2 tablets (5-10 mg total) by mouth 3 (three) times daily as needed. 20 tablet 0   DULoxetine  (CYMBALTA ) 30 MG capsule Take 30 mg by mouth daily.     furosemide  (LASIX ) 20 MG tablet Take 20 mg by mouth daily.     hydrochlorothiazide  (MICROZIDE ) 12.5 MG capsule Take 12.5 mg by mouth daily.     HYDROcodone -acetaminophen  (NORCO/VICODIN) 5-325 MG tablet Take 1 tablet by mouth every 8 (eight) hours as needed for severe pain (pain score 7-10). 10 tablet 0   predniSONE  (DELTASONE ) 10 MG tablet Take 1 tablet (10 mg total) by mouth daily. 6,5,4,3,2,1 six day taper 21 tablet 0   No current facility-administered medications for this visit.   No results found.  I have reviewed past medical, surgical, social and family history, medications, and allergies as documented in the EMR.   Review of Systems:    A ROS was performed including pertinent positives and negatives as documented in the HPI.   Physical Exam :    General/Constitutional: NAD and appears stated age Vascular: No edema, swelling or tenderness, except as noted in detailed exam Integumentary: No impressive skin lesions present, except as noted in detailed exam Neurological: Alert and oriented Psych: Appropriate affect and cooperative Musculoskeletal: Normal, except as noted in detailed exam and HPI There were no vitals taken for this visit.    Focused Orthopedic Exam:    Hip Examination (focused):   LEFT  ROM (degrees): Flexion-Extension 110-10    Abduction 30    Adduction 15    IR 15    ER 30   Palpation (pain): Anterior positive   Iliopsoas negative   Greater troch positive   ASIS negative   AIIS negative   Posterior positive   Adductors negative  Special Tests: FADIR positive   FABER positive   Log Roll positive   Ober's  positive   External Snapping Not performed   Internal Snapping Not performed   Stinchfield positive  Other: Hip flexion strength 4+/5   Hip adductor strength  5/5   Hip abductor strength 5/5    Vascular/Lymphatic: 2+ dorsalis pedis/posterior tibialis pulses,  foot warm and well perfused Neurologic: Sensation intact to light touch to Superficial peroneal/Deep peroneal/Tibial/Sural/Saphenous nerves     Imaging:    Xray (Left Hip): Xray of the left hip including 3-views (pelvis AP, hip AP, hip lateral) obtained today 01/11/2024 at [facility] were reviewed personally by me. Per my independent interpretation these images show severe left hip osteoarthritis with flattened and remodeled articular surfaces. No acute fracture.   Advanced Imaging:  Mri, Hip, W/o Contrast : [facility] [place] Patient Name: Karen Dalton  Case ID: [id number]  Patient DOB: 1961/11/05  Referring Physician: Frederic Don  Exam Date: 01/07/2024  Exam Description: MR Left Hip w/o Contrast  HISTORY: Evaluate left hip pathology. No priors, no surgical history, hip injections. No known injury. Patient having left hip pain, weakness, and numbness for two and a half years.  TECHNICAL FACTORS: Long- and short-axis fat- and water-weighted images were performed.  COMPARISON: Radiograph 03/25/2023.  FINDINGS: No sacrum fracture. Severe left hip osteoarthropathy. Chronically torn and worn labrum. High grade chondromalacia. Subcortical microfracturing and pseudocysts femoral head and acetabulum. 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 Complex effusion. Thickened and proliferative synovium. Debris and loose bodies within the capsule. Chronic partially torn gluteus minimus and medius insertions. Fibers attenuated and scarred distally. Fatty atrophy gluteus minimus muscle. No adductor muscle injury. Hamstring origins are intact. Diverticulosis coli.  CONCLUSION: 1. Severe left hip  osteoarthropathy. Flattened and remodeled articular surfaces. Regions of high grade chondromalacia. Pseudocysts and marrow reaction acetabulum femoral head. 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 Complex joint effusion. Markedly thick and proliferative synovium. 2. Chronic partially torn gluteus minimus and ventral fibers of gluteus medius insertion. Mild trochanteric bursal swelling. Fatty atrophy gluteus minimus muscle. Thank you for the opportunity to provide your interpretation. Juliene Carne, MD A: AO/cm 01/07/2024 6:26 PM EST        Assessment and Plan:    64 y.o. female was seen and examined in office today. We reviewed patient's history, examination, and imaging in detail. Based on information available for this encounter, patient with 1 to 3-year history of left hip pain.  Patient states pain triggered by fall this past April 2025, however notes present from [facility] in January and February 2025 which reveal report of left hip pain starting earlier.  Left hip MRI from 01/07/2024 and left hip x-ray performed in office today 01/10/2025 reveals severe left hip osteoarthropathy with component of chronic AVN of left femoral head.  Patient with groin pain.  Positive Stinchfield, logroll, FADIR, and FABER. Failed OTC and rx medications. Failed HEP/stretches and PT. No previous left hip intra-articular steroid injection; has received LESIs. Patient has previously been offered left THA per [facility], unclear why that surgery did not occur. Patient is interested in surgery at this time.   The decision to proceed with surgery was made at least in part based upon the patient's persistent symptoms, experienced most if not all days, which interferes with activities of daily living.  We discussed the risks, benefits, and alternatives to surgery in general, including the potential outcomes with foregoing treatment altogether. The risks include, but are not limited to, the risk of anesthesia up to and  including death, infection, bleeding, tendon damage, nerve damage, vascular damage, stiffness, weakness, loss of range of motion, arthrofibrosis, failure to alleviate symptoms, worsening of symptoms, continued pain, continued mechanical symptoms, arthritic pain, post traumatic arthritis, symptomatic hardware, failure of hardware or implants, wound healing problems, formation of cutaneous scars secondary to surgical incisions, deep venous thrombosis, pulmonary embolism, reflex sympathetic dystrophy, heterotopic ossification, unforeseen complications, and the need for further surgery. We did discuss the risks and benefits of forgoing treatment altogether, and living with symptoms as they are. She understood and wishes to proceed with left hip total arthroplasty. Informed consent was obtained, questions were entertained and answered to the best of my ability.  A discussion regarding chemical prophylaxis of deep venous thrombosis was undertaken with the patient. The  risks and benefits of DVT prophylaxis were reviewed.  We have discussed the proposed procedure, and what is planned during surgery. If any additional findings necessitate surgical intervention during the planned procedure, any and all pathology encountered at the time of surgery will be addressed. We talked about the fact that closure of surgical wounds may be performed by myself or a physician assistant.  We discussed in detail the rehabilitation associated with this procedure, in terms of frequency and duration. If a brace or sling is used postoperatively, driving a motor vehicle is not advised while braced in extension or while in a sling. The importance of proper rehabilitation in the overall outcome of the surgery was emphasized. The amount of time needed off from activities (work, school, sports, exercise, and leisure activity) was discussed. She understands that in no way does surgical intervention guarantee return to activities (athletic, work,  leisure, etc.) at the pre-injury level. Guarantees were neither stated nor implied. She understands and has appropriate expectations.  Patient will require clearance from PCP.   Lucie Stabs PA-C Orthopedic Surgery [facility] with [place]   This document was dictated using Dragon voice recognition software. A reasonable attempt at proof reading has been made to minimize errors.       [1]  Allergies Allergen Reactions   Codeine    Tramadol Nausea And Vomiting   "

## 2025-01-11 ENCOUNTER — Telehealth: Payer: Self-pay

## 2025-01-11 NOTE — Telephone Encounter (Signed)
 [facility] would surgical clearance form faxed to [phone], attn.: Mr. Hyacinth.  Cb# [phone].  Please advise.  Thank you.

## 2025-01-13 ENCOUNTER — Telehealth: Payer: Self-pay | Admitting: Physician Assistant

## 2025-01-13 NOTE — Telephone Encounter (Signed)
 Called patient again this afternoon; wish to discuss scheduling an appointment with Dr. Mariah to discuss/schedule surgery.  No answer. No voicemail option.  Called patient's emergency contact, father, Kayla People. No answer. Stated voicemail box full and was not able to leave a message.

## 2025-01-13 NOTE — Telephone Encounter (Signed)
 Called patient. No answer. No option for voicemail. Recommended calling directory to verify number.  Tried again. Same no answer.  Would like to schedule patient an appointment asap to review offered surgery (left hip arthroplasty).

## 2025-01-20 ENCOUNTER — Telehealth: Payer: Self-pay

## 2025-01-20 NOTE — Telephone Encounter (Signed)
 Patient states her pcp has not received the clearance form for surgery from Dr Mariah.

## 2025-01-20 NOTE — Telephone Encounter (Signed)
 Channing can you help with this please
